# Patient Record
Sex: Male | Born: 1955 | Race: White | Hispanic: No | Marital: Married | Smoking: Never smoker
Health system: Southern US, Community
[De-identification: ages and names within clinical notes are randomized; demographics above are authoritative.]

## PROBLEM LIST (undated history)

## (undated) ENCOUNTER — Ambulatory Visit

## (undated) DIAGNOSIS — K219 Gastro-esophageal reflux disease without esophagitis: Secondary | ICD-10-CM

## (undated) DIAGNOSIS — M722 Plantar fascial fibromatosis: Secondary | ICD-10-CM

## (undated) HISTORY — PX: NASAL SINUS SURGERY: SHX719

## (undated) HISTORY — PX: BACK SURGERY: SHX140

---

## 2015-06-16 ENCOUNTER — Ambulatory Visit
Admission: EM | Admit: 2015-06-16 | Discharge: 2015-06-16 | Disposition: A | Discharge status: Home / Self Care | Payer: BLUE CROSS/BLUE SHIELD | Attending: Family Medicine | Admitting: Family Medicine

## 2015-06-16 ENCOUNTER — Encounter: Payer: Self-pay | Admitting: Emergency Medicine

## 2015-06-16 DIAGNOSIS — J101 Influenza due to other identified influenza virus with other respiratory manifestations: Secondary | ICD-10-CM

## 2015-06-16 LAB — RAPID INFLUENZA A&B ANTIGENS: Influenza B (ARMC): POSITIVE — AB

## 2015-06-16 LAB — RAPID INFLUENZA A&B ANTIGENS (ARMC ONLY): INFLUENZA A (ARMC): NEGATIVE

## 2015-06-16 MED ORDER — HYDROCOD POLST-CPM POLST ER 10-8 MG/5ML PO SUER: 5.0000 mL | Freq: Two times a day (BID) | ORAL | Status: DC

## 2015-06-16 MED ORDER — ACETAMINOPHEN 500 MG PO TABS
1000.0000 mg | ORAL_TABLET | Freq: Once | ORAL | Status: AC
  Administered 2015-06-16: 1000 mg via ORAL

## 2015-06-16 MED ORDER — OSELTAMIVIR PHOSPHATE 75 MG PO CAPS: 75.0000 mg | ORAL_CAPSULE | Freq: Two times a day (BID) | ORAL | Status: DC

## 2015-06-16 NOTE — ED Notes (Signed)
Patient c/o cough, runny nose, bodyaches and fever for the past 3 days.

## 2015-06-16 NOTE — ED Provider Notes (Signed)
CSN: [id number]     Arrival date & time 06/16/15  1935 History   First MD Initiated Contact with Patient 06/16/15 2111     Chief Complaint  Patient presents with  . Cough  . Fever  . Generalized Body Aches   (Consider location/radiation/quality/duration/timing/severity/associated sxs/prior Treatment) HPI  60 year old male presents with the sudden onset of body aches cough productive of green sputum fever. He did not have a flu shot this year. Left work early today because he feels so terrible. Temperature is 100.2 degrees pulse 75 respirations 16 blood pressure 147/87 and O2 sats 97% on room air        History reviewed. No pertinent past medical history. Past Surgical History  Procedure Laterality Date  . Nasal sinus surgery    . Back surgery     Family History  Problem Relation Age of Onset  . Cancer Mother   . Cancer Father    Social History  Substance Use Topics  . Smoking status: Never Smoker   . Smokeless tobacco: None  . Alcohol Use: No    Review of Systems  Constitutional: Positive for fever, chills, activity change and fatigue.  HENT: Positive for congestion.   Respiratory: Positive for cough and shortness of breath.   All other systems reviewed and are negative.   Allergies  Review of patient's allergies indicates no known allergies.  Home Medications   Prior to Admission medications   Medication Sig Start Date End Date Taking? Authorizing Provider  chlorpheniramine-HYDROcodone (TUSSIONEX PENNKINETIC ER) 10-8 MG/5ML SUER Take 5 mLs by mouth 2 (two) times daily. 06/16/15   Lutricia Feil, PA-C  oseltamivir (TAMIFLU) 75 MG capsule Take 1 capsule (75 mg total) by mouth 2 (two) times daily. 06/16/15   Lutricia Feil, PA-C   Meds Ordered and Administered this Visit   Medications  acetaminophen (TYLENOL) tablet 1,000 mg (1,000 mg Oral Given 06/16/15 2006)    BP 147/87 mmHg  Pulse 75  Temp(Src) 99.3 F (37.4 C) (Tympanic)  Resp 16  Ht  (1.778 m)   Wt 220 lb (99.791 kg)  BMI 31.57 kg/m2  SpO2 97% No data found.   Physical Exam  Constitutional: He is oriented to person, place, and time. He appears well-developed and well-nourished. No distress.  HENT:  Head: Normocephalic and atraumatic.  Right Ear: External ear normal.  Left Ear: External ear normal.  Nose: Nose normal.  Mouth/Throat: Oropharynx is clear and moist.  Eyes: Conjunctivae are normal. Pupils are equal, round, and reactive to light. Right eye exhibits no discharge. Left eye exhibits no discharge.  Neck: Normal range of motion. Neck supple.  Pulmonary/Chest: Effort normal and breath sounds normal. No respiratory distress. He has no wheezes. He has no rales.  Musculoskeletal: Normal range of motion. He exhibits no edema or tenderness.  Lymphadenopathy:    He has no cervical adenopathy.  Neurological: He is alert and oriented to person, place, and time.  Skin: Skin is warm and dry. He is not diaphoretic.  Psychiatric: He has a normal mood and affect. His behavior is normal. Judgment and thought content normal.  Nursing note and vitals reviewed.   ED Course  Procedures (including critical care time)  Labs Review Labs Reviewed  RAPID INFLUENZA A&B ANTIGENS ([facility] ONLY) - Abnormal; Notable for the following:    Influenza B ([facility]) POSITIVE (*)    All other components within normal limits    Imaging Review No results found.   Visual Acuity Review  Right  Eye Distance:   Left Eye Distance:   Bilateral Distance:    Right Eye Near:   Left Eye Near:    Bilateral Near:         MDM   1. Influenza B    Discharge Medication List as of 06/16/2015  9:20 PM    START taking these medications   Details  chlorpheniramine-HYDROcodone (TUSSIONEX PENNKINETIC ER) 10-8 MG/5ML SUER Take 5 mLs by mouth 2 (two) times daily., Starting 06/16/2015, Until Discontinued, Print    oseltamivir (TAMIFLU) 75 MG capsule Take 1 capsule (75 mg total) by mouth 2 (two) times daily.,  Starting 06/16/2015, Until Discontinued, Normal      Plan: 1. Test/x-ray results and diagnosis reviewed with patient 2. rx as per orders; risks, benefits, potential side effects reviewed with patient 3. Recommend supportive treatment with Rest and fluids. Motrin or Tylenol for body aches and fever. We'll start him on Tamiflu and given him Tussionex for cough suppression. Improving or is worsening any so follow-up is primary care.  4. F/u prn if symptoms worsen or don't improve     Lutricia FeilWilliam P Yaniel Limbaugh, PA-C 06/16/15 2126

## 2015-06-16 NOTE — Discharge Instructions (Signed)
Influenza, Adult Influenza ("the flu") is a viral infection of the respiratory tract. It occurs more often in winter months because people spend more time in close contact with one another. Influenza can make you feel very sick. Influenza easily spreads from person to person (contagious). CAUSES  Influenza is caused by a virus that infects the respiratory tract. You can catch the virus by breathing in droplets from an infected person's cough or sneeze. You can also catch the virus by touching something that was recently contaminated with the virus and then touching your mouth, nose, or eyes. RISKS AND COMPLICATIONS You may be at risk for a more severe case of influenza if you smoke cigarettes, have diabetes, have chronic heart disease (such as heart failure) or lung disease (such as asthma), or if you have a weakened immune system. Elderly people and pregnant women are also at risk for more serious infections. The most common problem of influenza is a lung infection (pneumonia). Sometimes, this problem can require emergency medical care and may be life threatening. SIGNS AND SYMPTOMS  Symptoms typically last 4 to 10 days and may include:  Fever.  Chills.  Headache, body aches, and muscle aches.  Sore throat.  Chest discomfort and cough.  Poor appetite.  Weakness or feeling tired.  Dizziness.  Nausea or vomiting. DIAGNOSIS  Diagnosis of influenza is often made based on your history and a physical exam. A nose or throat swab test can be done to confirm the diagnosis. TREATMENT  In mild cases, influenza goes away on its own. Treatment is directed at relieving symptoms. For more severe cases, your health care provider may prescribe antiviral medicines to shorten the sickness. Antibiotic medicines are not effective because the infection is caused by a virus, not by bacteria. HOME CARE INSTRUCTIONS  Take medicines only as directed by your health care provider.  Use a cool mist humidifier  to make breathing easier.  Get plenty of rest until your temperature returns to normal. This usually takes 3 to 4 days.  Drink enough fluid to keep your urine clear or pale yellow.  Cover yourmouth and nosewhen coughing or sneezing,and wash your handswellto prevent thevirusfrom spreading.  Stay homefromwork orschool untilthe fever is gonefor at least 201full day. PREVENTION  An annual influenza vaccination (flu shot) is the best way to avoid getting influenza. An annual flu shot is now routinely recommended for all adults in the [place] SEEK MEDICAL CARE IF:  You experiencechest pain, yourcough worsens,or you producemore mucus.  Youhave nausea,vomiting, ordiarrhea.  Your fever returns or gets worse. SEEK IMMEDIATE MEDICAL CARE IF:  You havetrouble breathing, you become short of breath,or your skin ornails becomebluish.  You have severe painor stiffnessin the neck.  You develop a sudden headache, or pain in the face or ear.  You have nausea or vomiting that you cannot control. MAKE SURE YOU:   Understand these instructions.  Will watch your condition.  Will get help right away if you are not doing well or get worse.   This information is not intended to replace advice given to you by your health care provider. Make sure you discuss any questions you have with your health care provider.   Document Released: 02/24/2000 Document Revised: 03/19/2014 Document Reviewed: 05/28/2011 Elsevier Interactive Patient Education 2016 [employer]  Influenza, Adult Influenza ("the flu") is a viral infection of the respiratory tract. It occurs more often in winter months because people spend more time in close contact with one another. Influenza can make  03/19/2014 Document Reviewed: 05/28/2011  Elsevier Interactive Patient Education ©2016 [employer]    Influenza, Adult  Influenza ("the flu") is a viral infection of the respiratory tract. It occurs more often in winter months because people spend more time in close contact with one another. Influenza can make you feel very sick. Influenza easily spreads from person to person (contagious).  CAUSES   Influenza is caused by a virus that infects the respiratory tract. You can catch the virus by breathing in droplets from an  infected person's cough or sneeze. You can also catch the virus by touching something that was recently contaminated with the virus and then touching your mouth, nose, or eyes.  RISKS AND COMPLICATIONS  You may be at risk for a more severe case of influenza if you smoke cigarettes, have diabetes, have chronic heart disease (such as heart failure) or lung disease (such as asthma), or if you have a weakened immune system. Elderly people and pregnant women are also at risk for more serious infections. The most common problem of influenza is a lung infection (pneumonia). Sometimes, this problem can require emergency medical care and may be life threatening.  SIGNS AND SYMPTOMS   Symptoms typically last 4 to 10 days and may include:  · Fever.  · Chills.  · Headache, body aches, and muscle aches.  · Sore throat.  · Chest discomfort and cough.  · Poor appetite.  · Weakness or feeling tired.  · Dizziness.  · Nausea or vomiting.  DIAGNOSIS   Diagnosis of influenza is often made based on your history and a physical exam. A nose or throat swab test can be done to confirm the diagnosis.  TREATMENT   In mild cases, influenza goes away on its own. Treatment is directed at relieving symptoms. For more severe cases, your health care provider may prescribe antiviral medicines to shorten the sickness. Antibiotic medicines are not effective because the infection is caused by a virus, not by bacteria.  HOME CARE INSTRUCTIONS  · Take medicines only as directed by your health care provider.  · Use a cool mist humidifier to make breathing easier.  · Get plenty of rest until your temperature returns to normal. This usually takes 3 to 4 days.  · Drink enough fluid to keep your urine clear or pale yellow.  · Cover your mouth and nose when coughing or sneezing, and wash your hands well to prevent the virus from spreading.  · Stay home from work or school until the fever is gone for at least 1 full day.  PREVENTION   An annual influenza  vaccination (flu shot) is the best way to avoid getting influenza. An annual flu shot is now routinely recommended for all adults in the [place]  SEEK MEDICAL CARE IF:  · You experience chest pain, your cough worsens, or you produce more mucus.  · You have nausea, vomiting, or diarrhea.  · Your fever returns or gets worse.  SEEK IMMEDIATE MEDICAL CARE IF:  · You have trouble breathing, you become short of breath, or your skin or nails become bluish.  · You have severe pain or stiffness in the neck.  · You develop a sudden headache, or pain in the face or ear.  · You have nausea or vomiting that you cannot control.  MAKE SURE YOU:   · Understand these instructions.  · Will watch your condition.  · Will get help right away if you are not doing well or get worse.     This information is not intended to replace advice given to you by your health care provider. Make sure you discuss any questions

## 2015-06-21 ENCOUNTER — Ambulatory Visit (INDEPENDENT_AMBULATORY_CARE_PROVIDER_SITE_OTHER): Payer: BLUE CROSS/BLUE SHIELD

## 2015-06-21 ENCOUNTER — Encounter: Payer: Self-pay | Admitting: Emergency Medicine

## 2015-06-21 ENCOUNTER — Ambulatory Visit
Admission: EM | Admit: 2015-06-21 | Discharge: 2015-06-21 | Disposition: A | Discharge status: Home / Self Care | Payer: BLUE CROSS/BLUE SHIELD | Attending: Family Medicine | Admitting: Family Medicine

## 2015-06-21 DIAGNOSIS — J40 Bronchitis, not specified as acute or chronic: Secondary | ICD-10-CM

## 2015-06-21 MED ORDER — AZITHROMYCIN 250 MG PO TABS: ORAL_TABLET | ORAL | Status: DC

## 2015-06-21 MED ORDER — HYDROCOD POLST-CPM POLST ER 10-8 MG/5ML PO SUER: 5.0000 mL | Freq: Every evening | ORAL | Status: DC | PRN

## 2015-06-21 MED ORDER — BENZONATATE 100 MG PO CAPS: 100.0000 mg | ORAL_CAPSULE | Freq: Three times a day (TID) | ORAL | Status: DC | PRN

## 2015-06-21 NOTE — Discharge Instructions (Signed)
Take medication as prescribed. Rest. Drink plenty of fluids.  ° °Follow up with your primary care physician this week as needed. Return to Urgent care for new or worsening concerns.  ° °

## 2015-06-21 NOTE — ED Provider Notes (Signed)
[facility]  ____________________________________________  Time seen: Approximately 6:35 PM  I have reviewed the triage vital signs and the nursing notes.   HISTORY  Chief Complaint Cough   HPI Derrick Haynes is a 60 y.o. male presents with a complaint of 1.5 weeks of runny nose, nasal congestion, cough. Patient reports that he was seen in urgent care about a week ago was diagnosed with the flu. Patient states that he since completed his cough medicine as well as the Tamiflu. Patient reports overall the congestion has improved but continues with cough. Patient reports that he occasionally feels that he has chills and some body aches. Patient denies known fevers. Reports no fevers when he has checked it at home. States the cough is occasionally productive with some greenish phlegm. States occasionally has some sinus pressure and his cheeks and states that he can blow his nose and greenish mucus out. Denies headache.  Denies chest pain, shortness of breath, abdominal pain, dysuria, neck pain, back pain, weakness, dizziness, extremity pain or extremity swelling. Reports overall continues to eat and drink well. Denies other known sick contacts.  [place]   History reviewed. No pertinent past medical history. denies There are no active problems to display for this patient. denies   Past Surgical History  Procedure Laterality Date  . Nasal sinus surgery    . Back surgery      Current Outpatient Rx  Name  Route  Sig  Dispense  Refill  .           Marland Kitchen.             Allergies Review of patient's allergies indicates no known allergies.  Family History  Problem Relation Age of Onset  . Cancer Mother   . Cancer Father     Social History Social History  Substance Use Topics  . Smoking status: Never Smoker   . Smokeless tobacco: None  . Alcohol Use: No    Review of Systems Constitutional:As above Eyes: No visual changes. ENT: No sore throat. Positive runny  nose, nasal congestion and cough. Cardiovascular: Denies chest pain. Respiratory: Denies shortness of breath. Gastrointestinal: No abdominal pain.  No nausea, no vomiting.  No diarrhea.  No constipation. Genitourinary: Negative for dysuria. Musculoskeletal: Negative for back pain. Skin: Negative for rash. Neurological: Negative for headaches, focal weakness or numbness.  10-point ROS otherwise negative.  ____________________________________________   PHYSICAL EXAM:  VITAL SIGNS: ED Triage Vitals  Enc Vitals Group     BP 06/21/15 1818 143/80 mmHg     Pulse Rate 06/21/15 1818 68     Resp 06/21/15 1818 16     Temp 06/21/15 1818 98.5 F (36.9 C)     Temp Source 06/21/15 1818 Oral     SpO2 06/21/15 1818 98 %     Weight 06/21/15 1818 220 lb (99.791 kg)     Height 06/21/15 1818 5\' 10"  (1.778 m)     Head Cir --      Peak Flow --      Pain Score 06/21/15 1820 1     Pain Loc --      Pain Edu? --      Excl. in GC? --    Constitutional: Alert and oriented. Well appearing and in no acute distress. Eyes: Conjunctivae are normal. PERRL. EOMI. Head: Atraumatic.Mild tenderness to palpation bilateral frontal and maxillary sinuses. No swelling. No erythema.   Ears: no erythema, normal TMs bilaterally.   Nose: nasal congestion with bilateral  nasal turbinate erythema and edema.   Mouth/Throat: Mucous membranes are moist.  Oropharynx non-erythematous.No tonsillar swelling or exudate.  Neck: No stridor.  No cervical spine tenderness to palpation. Hematological/Lymphatic/Immunilogical: No cervical lymphadenopathy. Cardiovascular: Normal rate, regular rhythm. Grossly normal heart sounds.  Good peripheral circulation. Respiratory: Normal respiratory effort.  No retractions. Mild scattered rhonchi, increased mild rhonchi left upper lobe. Rhonchi improves after cough. Dry intermittent cough noted in room. No wheezes or rales. Speaking in complete sentences. Ambulatory in room while speaking in  complete sentences. Good air movement.  Gastrointestinal: Soft and nontender. No distention. Normal Bowel sounds. No CVA tenderness. Musculoskeletal: No lower or upper extremity tenderness nor edema.  Bilateral pedal pulses equal and easily palpated. No cervical, thoracic or lumbar tenderness to palpation. No calf tenderness bilaterally.  Neurologic:  Normal speech and language. No gross focal neurologic deficits are appreciated. No gait instability. Skin:  Skin is warm, dry and intact. No rash noted. Psychiatric: Mood and affect are normal. Speech and behavior are normal.  ____________________________________________   LABS (all labs ordered are listed, but only abnormal results are displayed)  Labs Reviewed - No data to display ____________________________________________  RADIOLOGY   EXAM: CHEST 2 VIEW  COMPARISON: None.  FINDINGS: The heart size and mediastinal contours are within normal limits. Both lungs are clear. The visualized skeletal structures are unremarkable.  IMPRESSION: No active cardiopulmonary disease.   Electronically Signed By: Alcide Clever M.D. On: 06/21/2015 19:00  I, Renford Dills, personally viewed and evaluated these images (plain radiographs) as part of my medical decision making, as well as reviewing the written report by the radiologist.    INITIAL IMPRESSION / ASSESSMENT AND PLAN / ED COURSE  Pertinent labs & imaging results that were available during my care of the patient were reviewed by me and considered in my medical decision making (see chart for details).  Very well-appearing patient. No acute distress. Presents for the complaints of continued cough and congestion for the last week post influenza. Reports overall symptoms present 1.5-2 weeks. Lungs with scattered rhonchi increase in left upper lobe. Improves post cough. No wheezes. Speaks in complete sentences. Denies chest pain or shortness of breath. Also with nasal drainage.  Will evaluate chest x-ray. Suspect post influenza bronchitis.  Chest x-ray per radiologist no active cardiopulmonary disease. Will treat bronchitis with oral azithromycin, when necessary Tessalon Perles in the day and when necessary Tussionex at night. Patient reports he took Tussionex When he had the flu and states that that helped his cough quite a bit and tolerated well. Encouraged rest, fluids, over-the-counter Tylenol or ibuprofen. Encourage PCP follow up.Discussed indication, risks and benefits of medications with patient.  Discussed follow up with Primary care physician this week. Discussed follow up and return parameters including no resolution or any worsening concerns. Patient verbalized understanding and agreed to plan.   ____________________________________________   FINAL CLINICAL IMPRESSION(S) / ED DIAGNOSES  Final diagnoses:  Bronchitis      Note: This dictation was prepared with Dragon dictation along with smaller phrase technology. Any transcriptional errors that result from this process are unintentional.    Renford Dills, NP 06/21/15 2020

## 2015-06-21 NOTE — ED Notes (Signed)
Patient c/o cough that has not improved.  Patient denies fever.  Patient states that he finished his Tamiflu.  Patient reports that the Tussionex has helped his cough at night.

## 2016-11-11 IMAGING — CR DG CHEST 2V
2 series · 2 of 2 positions shown · non-contrast
Comparison: None.

CLINICAL DATA: Chills and productive cough for 1 week

EXAM:
CHEST  2 VIEW

[chest pa]
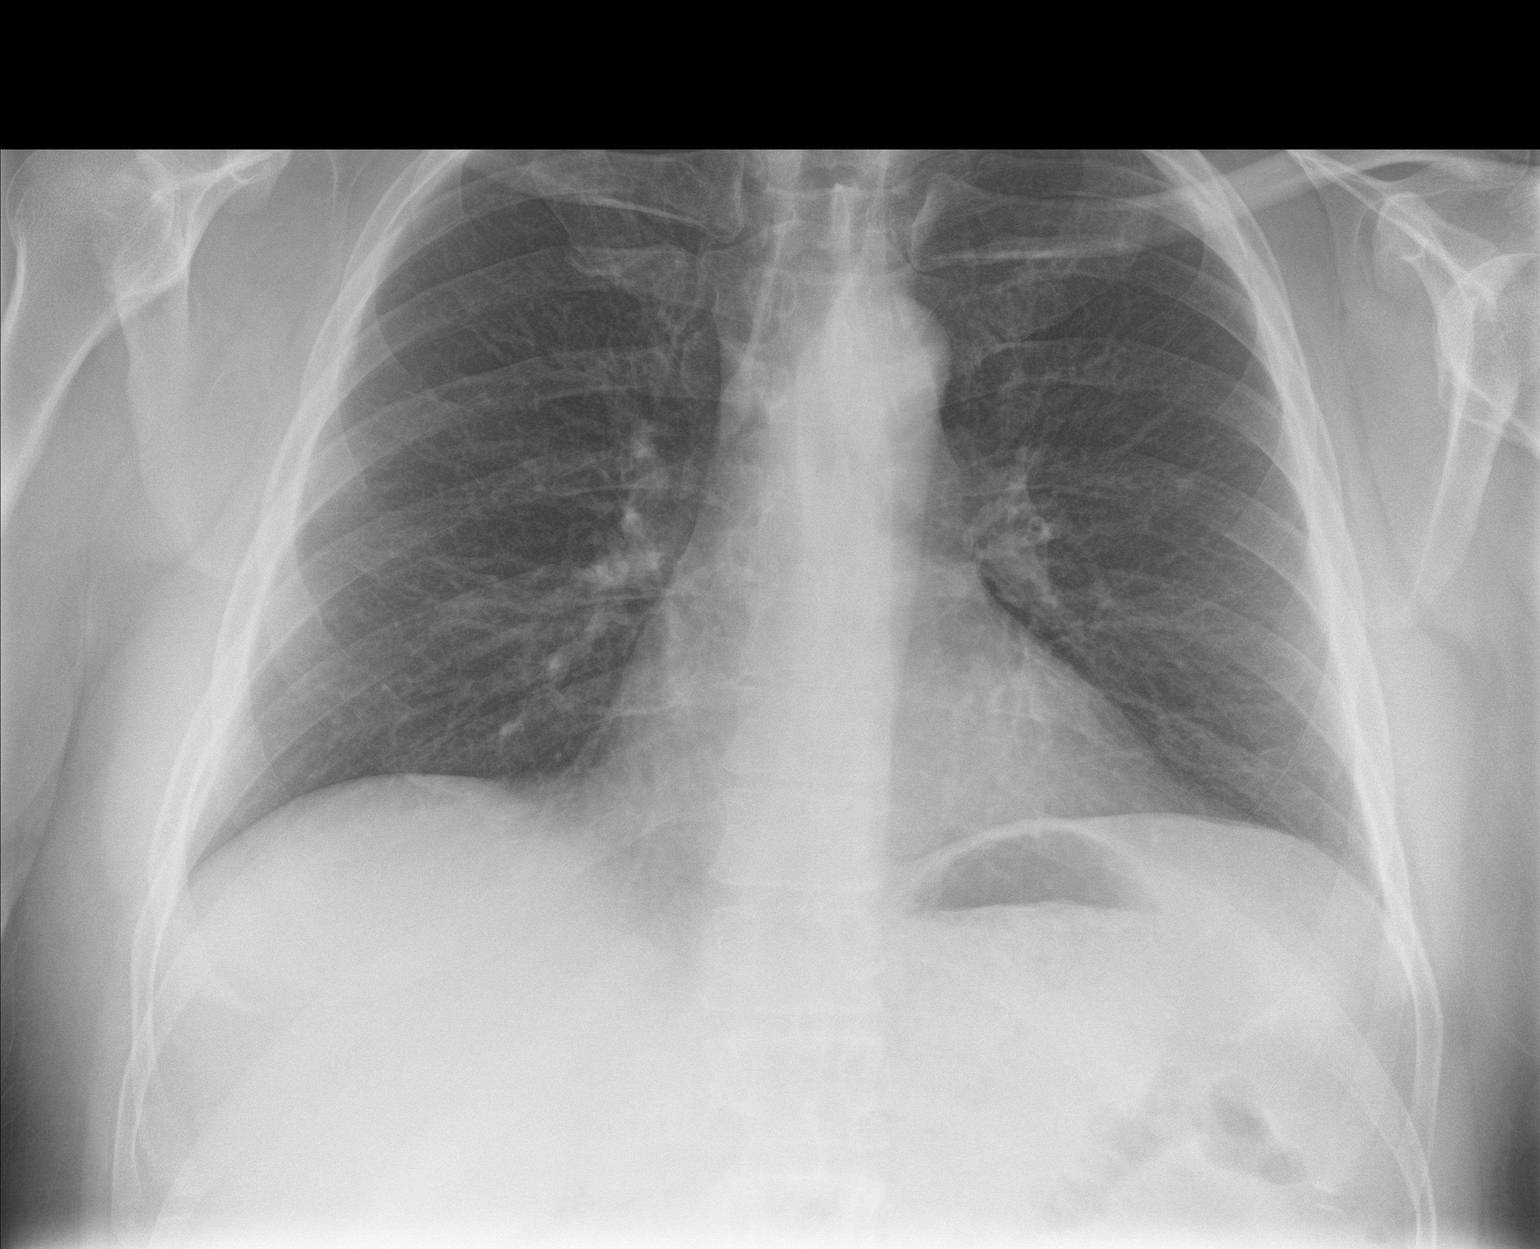

[chest lat]
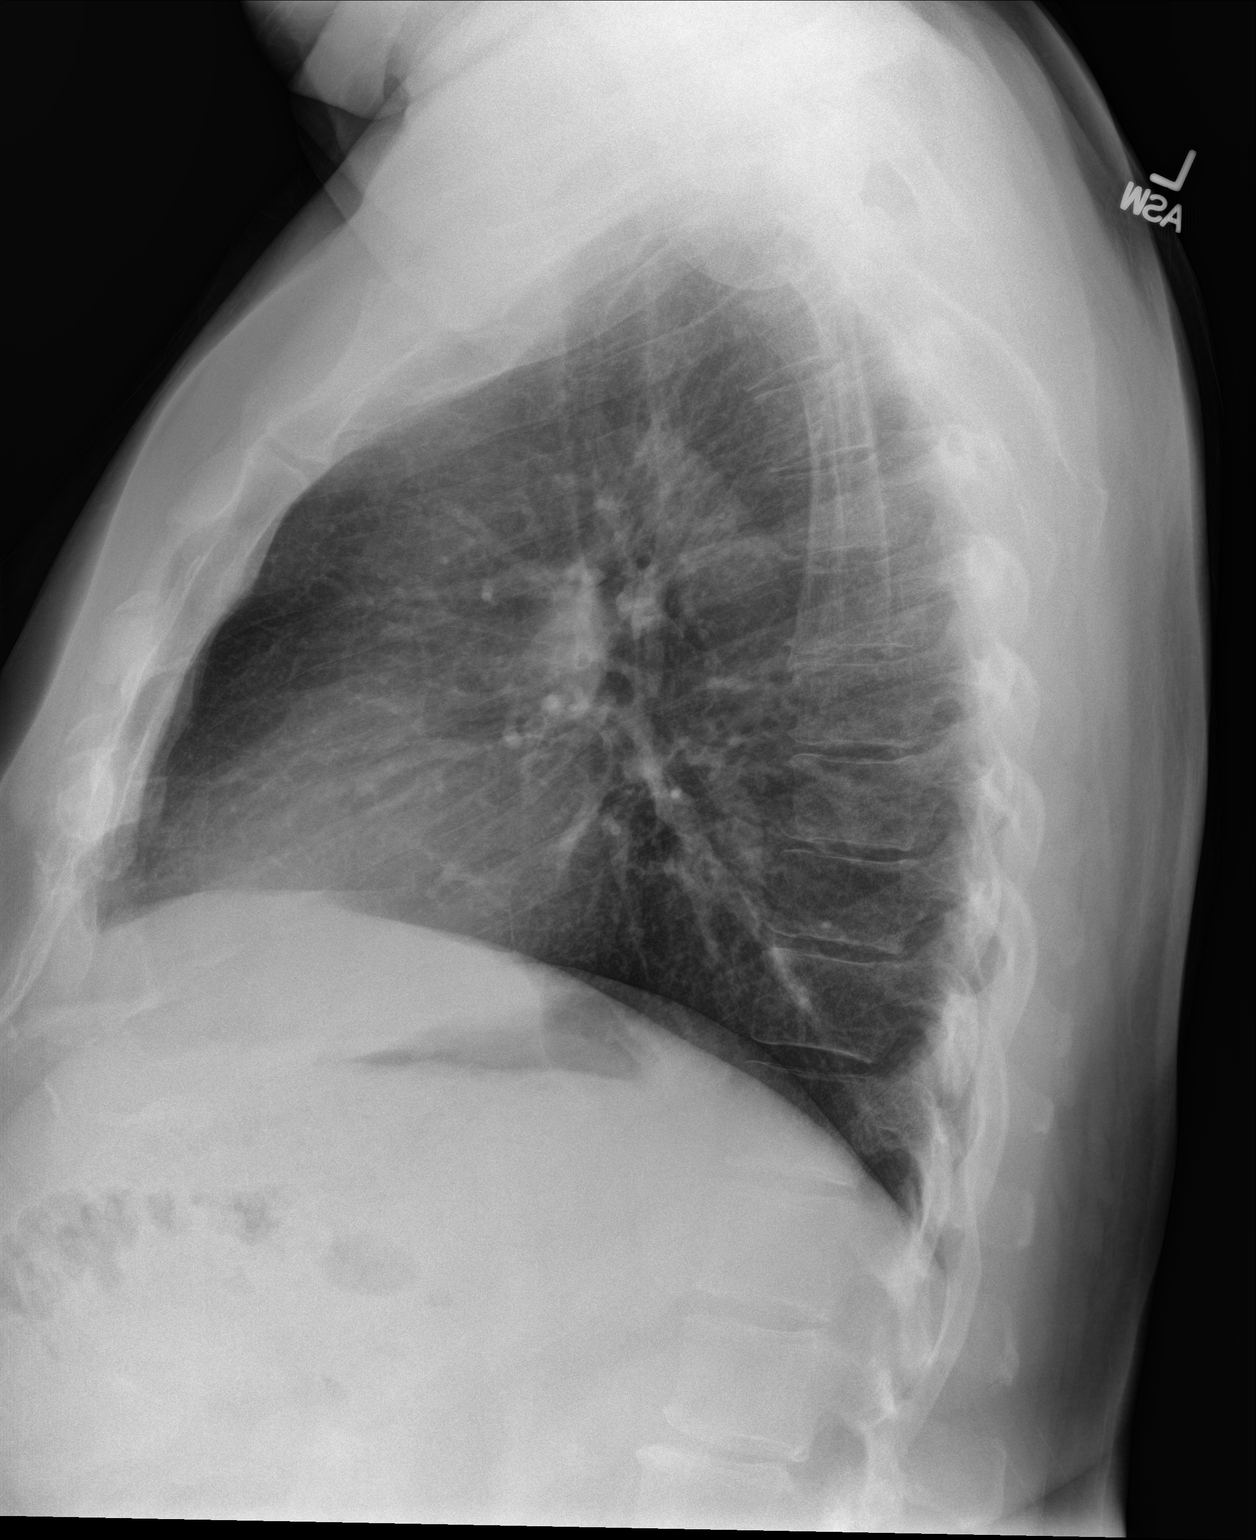

[2 of 2 positions shown; findings below may reference images not displayed]

FINDINGS: The heart size and mediastinal contours are within normal limits.
Both lungs are clear. The visualized skeletal structures are
unremarkable.
IMPRESSION: No active cardiopulmonary disease.

## 2017-07-22 ENCOUNTER — Other Ambulatory Visit: Payer: Self-pay

## 2017-07-22 ENCOUNTER — Ambulatory Visit
Admission: EM | Admit: 2017-07-22 | Discharge: 2017-07-22 | Disposition: A | Discharge status: Home / Self Care | Payer: 59 | Attending: Family Medicine | Admitting: Family Medicine

## 2017-07-22 DIAGNOSIS — M542 Cervicalgia: Secondary | ICD-10-CM

## 2017-07-22 DIAGNOSIS — M546 Pain in thoracic spine: Secondary | ICD-10-CM | POA: Diagnosis not present

## 2017-07-22 HISTORY — DX: Gastro-esophageal reflux disease without esophagitis: K21.9

## 2017-07-22 HISTORY — DX: Plantar fascial fibromatosis: M72.2

## 2017-07-22 MED ORDER — PREDNISONE 10 MG (21) PO TBPK: ORAL_TABLET | ORAL | 0 refills | Status: DC

## 2017-07-22 MED ORDER — CYCLOBENZAPRINE HCL 10 MG PO TABS: ORAL_TABLET | ORAL | 0 refills | Status: DC

## 2017-07-22 NOTE — Discharge Instructions (Addendum)
Recommend start Prednisone  6 day dose pack as directed- take 6 tablets by mouth today and then decrease by 1 tablet each day until finished on day 6. Recommend Flexeril muscle relaxer- take 1/2 to 1 whole tablet up to 3 times a day as needed for muscle spasms/pain. Recommend apply heat and alternate with cool compresses to area for comfort. Recommend contact your Orthopedic in 3 to 5 days if not improving.
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
## 2020-05-06 ENCOUNTER — Ambulatory Visit
Admission: EM | Admit: 2020-05-06 | Discharge: 2020-05-06 | Disposition: A | Discharge status: Home / Self Care | Payer: 59 | Attending: Family Medicine | Admitting: Family Medicine

## 2020-05-06 ENCOUNTER — Ambulatory Visit (INDEPENDENT_AMBULATORY_CARE_PROVIDER_SITE_OTHER): Payer: 59

## 2020-05-06 ENCOUNTER — Other Ambulatory Visit: Payer: Self-pay

## 2020-05-06 DIAGNOSIS — R0781 Pleurodynia: Secondary | ICD-10-CM

## 2020-05-06 DIAGNOSIS — R062 Wheezing: Secondary | ICD-10-CM

## 2020-05-06 DIAGNOSIS — R059 Cough, unspecified: Secondary | ICD-10-CM

## 2020-05-06 MED ORDER — PREDNISONE 50 MG PO TABS: ORAL_TABLET | ORAL | 0 refills | Status: DC

## 2020-05-06 MED ORDER — HYDROCOD POLST-CPM POLST ER 10-8 MG/5ML PO SUER: 5.0000 mL | Freq: Two times a day (BID) | ORAL | 0 refills | Status: DC | PRN

## 2020-05-06 MED ORDER — ALBUTEROL SULFATE HFA 108 (90 BASE) MCG/ACT IN AERS: 1.0000 | INHALATION_SPRAY | Freq: Four times a day (QID) | RESPIRATORY_TRACT | 0 refills | Status: DC | PRN

## 2020-05-06 NOTE — ED Provider Notes (Signed)
[facility]    CSN: [id number] Arrival date & time: 05/06/20  0808      History   Chief Complaint Chief Complaint  Patient presents with  . Cough   HPI  65 year old male presents with cough.  Symptoms started Wednesday.  He reports cough, chills, rib pain. No documented fever. No reported sick contacts. Cough is productive. No relieving factors. No other complaints at this time.  Past Medical History:  Diagnosis Date  . GERD (gastroesophageal reflux disease)   . Plantar fasciitis    Past Surgical History:  Procedure Laterality Date  . BACK SURGERY    . NASAL SINUS SURGERY     Home Medications    Prior to Admission medications   Medication Sig Start Date End Date Taking? Authorizing Provider  albuterol (VENTOLIN HFA) 108 (90 Base) MCG/ACT inhaler Inhale 1-2 puffs into the lungs every 6 (six) hours as needed for wheezing or shortness of breath. 05/06/20  Yes Johan Creveling G, DO  chlorpheniramine-HYDROcodone (TUSSIONEX PENNKINETIC ER) 10-8 MG/5ML SUER Take 5 mLs by mouth every 12 (twelve) hours as needed. 05/06/20  Yes Jamel Holzmann, Verdis Frederickson, DO  predniSONE (DELTASONE) 50 MG tablet 1 tablet daily x 5 days 05/06/20  Yes Tommie Sams, DO  omeprazole (PRILOSEC) 40 MG capsule  07/08/17 05/06/20  [provider]    Family History Family History  Problem Relation Age of Onset  . Cancer Mother   . Cancer Father     Social History Social History   Tobacco Use  . Smoking status: Never Smoker  . Smokeless tobacco: Never Used  Vaping Use  . Vaping Use: Never used  Substance Use Topics  . Alcohol use: Yes    Comment: rare  . Drug use: Never     Allergies   Patient has no known allergies.   Review of Systems Review of Systems  Constitutional: Positive for chills. Negative for fever.  Respiratory: Positive for cough and wheezing.    Physical Exam Triage Vital Signs ED Triage Vitals  Enc Vitals Group     BP 05/06/20 0850 136/81     Pulse Rate 05/06/20  0850 (!) 58     Resp 05/06/20 0850 18     Temp 05/06/20 0850 98.6 F (37 C)     Temp Source 05/06/20 0850 Oral     SpO2 05/06/20 0850 98 %     Weight 05/06/20 0849 215 lb (97.5 kg)     Height 05/06/20 0849 5\' 9"  (1.753 m)     Head Circumference --      Peak Flow --      Pain Score 05/06/20 0848 9     Pain Loc --      Pain Edu? --      Excl. in GC? --    Updated Vital Signs BP 136/81 (BP Location: Left Arm)   Pulse (!) 58   Temp 98.6 F (37 C) (Oral)   Resp 18   Ht 5\' 9"  (1.753 m)   Wt 97.5 kg   SpO2 98%   BMI 31.75 kg/m   Visual Acuity Right Eye Distance:   Left Eye Distance:   Bilateral Distance:    Right Eye Near:   Left Eye Near:    Bilateral Near:     Physical Exam Vitals and nursing note reviewed.  Constitutional:      General: He is not in acute distress.    Appearance: Normal appearance. He is not ill-appearing.  HENT:  Head: Normocephalic and atraumatic.  Eyes:     General:        Right eye: No discharge.        Left eye: No discharge.     Conjunctiva/sclera: Conjunctivae normal.  Cardiovascular:     Rate and Rhythm: Regular rhythm. Bradycardia present.  Pulmonary:     Effort: Pulmonary effort is normal. No respiratory distress.     Breath sounds: Wheezing present.  Neurological:     Mental Status: He is alert.  Psychiatric:        Mood and Affect: Mood normal.        Behavior: Behavior normal.    UC Treatments / Results  Labs (all labs ordered are listed, but only abnormal results are displayed) Labs Reviewed - No data to display  EKG   Radiology DG Chest 2 View  Result Date: 05/06/2020 CLINICAL DATA:  Cough, chest congestion, chills, and rib pain since Wednesday. EXAM: CHEST - 2 VIEW COMPARISON:  Chest x-ray dated June 21, 2015. FINDINGS: The heart size and mediastinal contours are within normal limits. Both lungs are clear. The visualized skeletal structures are unremarkable. IMPRESSION: No active cardiopulmonary disease.  Electronically Signed   By: Obie Dredge M.D.   On: 05/06/2020 09:55    Procedures Procedures (including critical care time)  Medications Ordered in UC Medications - No data to display  Initial Impression / Assessment and Plan / UC Course  I have reviewed the triage vital signs and the nursing notes.  Pertinent labs & imaging results that were available during my care of the patient were reviewed by me and considered in my medical decision making (see chart for details).    65 year old male presents with cough and wheezing.  Chest x-ray was obtained was independently by me.  Interpretation: Normal chest x-ray.  No evidence of infiltrate.  Placed on prednisone, albuterol, Tussionex.  Supportive care.  Final Clinical Impressions(s) / UC Diagnoses   Final diagnoses:  Cough  Wheezing     Discharge Instructions     Medication as prescribed.  Chest xray normal.  Take care  Dr. Adriana Simas    ED Prescriptions    Medication Sig Dispense Auth. Provider   predniSONE (DELTASONE) 50 MG tablet 1 tablet daily x 5 days 5 tablet Denene Alamillo G, DO   albuterol (VENTOLIN HFA) 108 (90 Base) MCG/ACT inhaler Inhale 1-2 puffs into the lungs every 6 (six) hours as needed for wheezing or shortness of breath. 18 g Lowen Barringer G, DO   chlorpheniramine-HYDROcodone (TUSSIONEX PENNKINETIC ER) 10-8 MG/5ML SUER Take 5 mLs by mouth every 12 (twelve) hours as needed. 115 mL Tommie Sams, DO     PDMP not reviewed this encounter.   Tommie Sams, DO 05/06/20 1050

## 2020-05-06 NOTE — Discharge Instructions (Signed)
Medication as prescribed.  Chest xray normal.  Take care  Dr. Adriana Simas

## 2020-05-06 NOTE — ED Triage Notes (Signed)
Patient complains of cough, chest congestion, rib pain and chills x Wednesday evening.

## 2021-08-10 ENCOUNTER — Ambulatory Visit
Admission: EM | Admit: 2021-08-10 | Discharge: 2021-08-10 | Disposition: A | Discharge status: Home / Self Care | Payer: Medicare Other

## 2021-08-10 DIAGNOSIS — J069 Acute upper respiratory infection, unspecified: Secondary | ICD-10-CM | POA: Diagnosis not present

## 2021-08-10 DIAGNOSIS — R051 Acute cough: Secondary | ICD-10-CM

## 2021-08-10 MED ORDER — DOXYCYCLINE HYCLATE 100 MG PO CAPS: 100.0000 mg | ORAL_CAPSULE | Freq: Two times a day (BID) | ORAL | 0 refills | Status: DC

## 2021-08-10 NOTE — Discharge Instructions (Addendum)
Please get established with PCP of your choice for general medical issues. Continue nasacort, claritin as directed. Take antibiotic as directed. Follow up with PCP. Return as needed. If you develop SOB, or chest pain go to ER. Drink plent yof water to stay hydrated. Avoid sunlight while on medication as it may cause photosensitivity/rash.

## 2021-08-10 NOTE — ED Triage Notes (Signed)
Pt here with C/O cough since Friday, spitting up green stuff. No fever, just feels off.

## 2021-08-10 NOTE — ED Provider Notes (Signed)
[facility]    CSN: [id number] Arrival date & time: 08/10/21  1119      History   Chief Complaint Chief Complaint  Patient presents with   Cough    HPI Derrick Haynes is a 66 y.o. male.   66 year old male patient, Derrick Haynes, presents to urgent care chief complaint of cough x5 days, spitting up green stuff. Pt states cough and symptoms are worse when lies down, +facial pressure.   The history is provided by the patient. No language interpreter was used.   Past Medical History:  Diagnosis Date   GERD (gastroesophageal reflux disease)    Plantar fasciitis     Patient Active Problem List   Diagnosis Date Noted   Acute URI 08/10/2021   Acute cough 08/10/2021    Past Surgical History:  Procedure Laterality Date   BACK SURGERY     NASAL SINUS SURGERY         Home Medications    Prior to Admission medications   Medication Sig Start Date End Date Taking? Authorizing Provider  atorvastatin (LIPITOR) 20 MG tablet Take 1 tablet by mouth daily. 05/08/21  Yes [provider]  doxycycline (VIBRAMYCIN) 100 MG capsule Take 1 capsule (100 mg total) by mouth 2 (two) times daily. 08/10/21  Yes Leondra Cullin, Para March, NP  albuterol (VENTOLIN HFA) 108 (90 Base) MCG/ACT inhaler Inhale 1-2 puffs into the lungs every 6 (six) hours as needed for wheezing or shortness of breath. 05/06/20   Tommie Sams, DO  chlorpheniramine-HYDROcodone (TUSSIONEX PENNKINETIC ER) 10-8 MG/5ML SUER Take 5 mLs by mouth every 12 (twelve) hours as needed. 05/06/20   Tommie Sams, DO  predniSONE (DELTASONE) 50 MG tablet 1 tablet daily x 5 days 05/06/20   Tommie Sams, DO  omeprazole (PRILOSEC) 40 MG capsule  07/08/17 05/06/20  [provider]    Family History Family History  Problem Relation Age of Onset   Cancer Mother    Cancer Father     Social History Social History   Tobacco Use   Smoking status: Never   Smokeless tobacco: Never  Vaping Use   Vaping Use: Never used   Substance Use Topics   Alcohol use: Yes    Comment: rare   Drug use: Never     Allergies   Patient has no known allergies.   Review of Systems Review of Systems  HENT:  Positive for congestion, sinus pressure and sinus pain.   Respiratory:  Positive for cough.   All other systems reviewed and are negative.   Physical Exam Triage Vital Signs ED Triage Vitals  Enc Vitals Group     BP 08/10/21 1144 140/90     Pulse Rate 08/10/21 1144 73     Resp 08/10/21 1144 18     Temp 08/10/21 1144 98.2 F (36.8 C)     Temp Source 08/10/21 1144 Oral     SpO2 08/10/21 1144 96 %     Weight 08/10/21 1143 230 lb (104.3 kg)     Height 08/10/21 1143 5\' 10"  (1.778 m)     Head Circumference --      Peak Flow --      Pain Score 08/10/21 1143 0     Pain Loc --      Pain Edu? --      Excl. in GC? --    No data found.  Updated Vital Signs BP 140/90 (BP Location: Left Arm)   Pulse 73   Temp  98.2 F (36.8 C) (Oral)   Resp 18   Ht  (1.778 m)   Wt 230 lb (104.3 kg)   SpO2 96%   BMI 33.00 kg/m   Visual Acuity Right Eye Distance:   Left Eye Distance:   Bilateral Distance:    Right Eye Near:   Left Eye Near:    Bilateral Near:     Physical Exam Vitals and nursing note reviewed.  Constitutional:      General: He is not in acute distress.    Appearance: He is well-developed and well-groomed.  HENT:     Head: Normocephalic and atraumatic.     Right Ear: Tympanic membrane is retracted.     Left Ear: Tympanic membrane is retracted.     Nose: Congestion present.     Mouth/Throat:     Lips: Pink.     Mouth: Mucous membranes are moist.     Pharynx: Oropharynx is clear.  Eyes:     General: Lids are normal. Vision grossly intact.     Extraocular Movements: Extraocular movements intact.     Conjunctiva/sclera: Conjunctivae normal.     Pupils: Pupils are equal, round, and reactive to light.  Neck:     Trachea: Trachea normal.  Cardiovascular:     Rate and Rhythm: Normal  rate and regular rhythm.     Pulses: Normal pulses.     Heart sounds: Normal heart sounds. No murmur heard. Pulmonary:     Effort: Pulmonary effort is normal. No respiratory distress.     Breath sounds: Normal breath sounds and air entry.  Abdominal:     Palpations: Abdomen is soft.     Tenderness: There is no abdominal tenderness.  Musculoskeletal:        General: No swelling.     Cervical back: Normal range of motion and neck supple.  Skin:    General: Skin is warm and dry.     Capillary Refill: Capillary refill takes less than 2 seconds.  Neurological:     General: No focal deficit present.     Mental Status: He is alert and oriented to person, place, and time.     GCS: GCS eye subscore is 4. GCS verbal subscore is 5. GCS motor subscore is 6.  Psychiatric:        Attention and Perception: Attention normal.        Mood and Affect: Mood normal.        Speech: Speech normal.        Behavior: Behavior normal. Behavior is cooperative.     UC Treatments / Results  Labs (all labs ordered are listed, but only abnormal results are displayed) Labs Reviewed - No data to display  EKG   Radiology No results found.  Procedures Procedures (including critical care time)  Medications Ordered in UC Medications - No data to display  Initial Impression / Assessment and Plan / UC Course  I have reviewed the triage vital signs and the nursing notes.  Pertinent labs & imaging results that were available during my care of the patient were reviewed by me and considered in my medical decision making (see chart for details).     Ddx: URI, cough, sinusitis,sseasonal allergies Final Clinical Impressions(s) / UC Diagnoses   Final diagnoses:  Acute URI  Acute cough     Discharge Instructions      Please get established with PCP of your choice for general medical issues. Continue nasacort, claritin as directed. Take antibiotic as  directed. Follow up with PCP. Return as needed. If  you develop SOB, or chest pain go to ER. Drink plent yof water to stay hydrated. Avoid sunlight while on medication as it may cause photosensitivity/rash.     ED Prescriptions     Medication Sig Dispense Auth. Provider   doxycycline (VIBRAMYCIN) 100 MG capsule Take 1 capsule (100 mg total) by mouth 2 (two) times daily. 20 capsule Gwendoline Judy, Para March, NP      PDMP not reviewed this encounter.   Clancy Gourd, NP 08/10/21 1313

## 2021-10-10 HISTORY — PX: EYE SURGERY: SHX253

## 2021-12-22 ENCOUNTER — Ambulatory Visit
Admission: EM | Admit: 2021-12-22 | Discharge: 2021-12-22 | Disposition: A | Discharge status: Home / Self Care | Payer: Medicare Other

## 2021-12-22 DIAGNOSIS — J069 Acute upper respiratory infection, unspecified: Secondary | ICD-10-CM | POA: Diagnosis not present

## 2021-12-22 MED ORDER — DOXYCYCLINE MONOHYDRATE 100 MG PO CAPS: 100.0000 mg | ORAL_CAPSULE | Freq: Two times a day (BID) | ORAL | 0 refills | Status: DC

## 2021-12-22 NOTE — ED Provider Notes (Signed)
[facility]    CSN: [id number] Arrival date & time: 12/22/21  1022      History   Chief Complaint Chief Complaint  Patient presents with   Cough   Nasal Congestion    HPI Derrick Haynes is a 66 y.o. male.   Patient presents with nasal congestion, rhinorrhea, sore throat and a productive cough with clear sputum for 3 weeks.  Associated shortness of breath at rest and intermittent wheezing.  Sore throat has resolved.  Tolerating food and liquids.  No known sick contacts.  Has attempted use of aspirin which has been ineffective.  Denies respiratory history.  Non-smoker.  Past Medical History:  Diagnosis Date   GERD (gastroesophageal reflux disease)    Plantar fasciitis     Patient Active Problem List   Diagnosis Date Noted   Acute URI 08/10/2021   Acute cough 08/10/2021    Past Surgical History:  Procedure Laterality Date   BACK SURGERY     EYE SURGERY  10/2021   NASAL SINUS SURGERY         Home Medications    Prior to Admission medications   Medication Sig Start Date End Date Taking? Authorizing Provider  escitalopram (LEXAPRO) 10 MG tablet Take 10 mg by mouth daily. 07/09/21  Yes [provider]  albuterol (VENTOLIN HFA) 108 (90 Base) MCG/ACT inhaler Inhale 1-2 puffs into the lungs every 6 (six) hours as needed for wheezing or shortness of breath. 05/06/20   Thersa Salt G, DO  atorvastatin (LIPITOR) 20 MG tablet Take 1 tablet by mouth daily. 05/08/21   [provider]  chlorpheniramine-HYDROcodone (TUSSIONEX PENNKINETIC ER) 10-8 MG/5ML SUER Take 5 mLs by mouth every 12 (twelve) hours as needed. 05/06/20   Coral Spikes, DO  doxycycline (VIBRAMYCIN) 100 MG capsule Take 1 capsule (100 mg total) by mouth 2 (two) times daily. 4/0/98   Defelice, Jeanett Schlein, NP  predniSONE (DELTASONE) 50 MG tablet 1 tablet daily x 5 days 05/06/20   Coral Spikes, DO  omeprazole (PRILOSEC) 40 MG capsule  07/08/17 05/06/20  [provider]    Family  History Family History  Problem Relation Age of Onset   Cancer Mother    Cancer Father     Social History Social History   Tobacco Use   Smoking status: Never   Smokeless tobacco: Never  Vaping Use   Vaping Use: Never used  Substance Use Topics   Alcohol use: Yes    Comment: rare   Drug use: Never     Allergies   Patient has no known allergies.   Review of Systems Review of Systems Defer to HPI    Physical Exam Triage Vital Signs ED Triage Vitals  Enc Vitals Group     BP 12/22/21 1033 (!) 145/83     Pulse Rate 12/22/21 1033 95     Resp 12/22/21 1033 12     Temp 12/22/21 1033 98.1 F (36.7 C)     Temp Source 12/22/21 1033 Oral     SpO2 12/22/21 1033 95 %     Weight 12/22/21 1031 235 lb (106.6 kg)     Height 12/22/21 1031 5\' 10"  (1.778 m)     Head Circumference --      Peak Flow --      Pain Score 12/22/21 1031 10     Pain Loc --      Pain Edu? --      Excl. in [place]? --    No  data found.  Updated Vital Signs BP (!) 145/83 (BP Location: Left Arm)   Pulse 95   Temp 98.1 F (36.7 C) (Oral)   Resp 12   Ht 5\' 10"  (1.778 m)   Wt 235 lb (106.6 kg)   SpO2 95%   BMI 33.72 kg/m   Visual Acuity Right Eye Distance:   Left Eye Distance:   Bilateral Distance:    Right Eye Near:   Left Eye Near:    Bilateral Near:     Physical Exam Constitutional:      Appearance: Normal appearance.  HENT:     Head: Normocephalic.     Right Ear: Tympanic membrane, ear canal and external ear normal.     Left Ear: Tympanic membrane, ear canal and external ear normal.     Nose: Congestion present.     Mouth/Throat:     Mouth: Mucous membranes are moist.     Pharynx: Posterior oropharyngeal erythema present.  Eyes:     Extraocular Movements: Extraocular movements intact.  Cardiovascular:     Rate and Rhythm: Normal rate and regular rhythm.     Pulses: Normal pulses.     Heart sounds: Normal heart sounds.  Pulmonary:     Effort: Pulmonary effort is normal.      Breath sounds: Normal breath sounds.  Musculoskeletal:        General: Normal range of motion.  Skin:    General: Skin is warm and dry.  Neurological:     Mental Status: He is alert and oriented to person, place, and time. Mental status is at baseline.  Psychiatric:        Mood and Affect: Mood normal.        Behavior: Behavior normal.      UC Treatments / Results  Labs (all labs ordered are listed, but only abnormal results are displayed) Labs Reviewed - No data to display  EKG   Radiology No results found.  Procedures Procedures (including critical care time)  Medications Ordered in UC Medications - No data to display  Initial Impression / Assessment and Plan / UC Course  I have reviewed the triage vital signs and the nursing notes.  Pertinent labs & imaging results that were available during my care of the patient were reviewed by me and considered in my medical decision making (see chart for details).  Acute upper respiratory infection  Vital signs are stable patient is in no signs of distress nontoxic-appearing, lungs are clear to auscultation and O2 saturations 95% on room air, low suspicion for pneumonia or bronchitis and therefore will defer imaging, symptoms have been present for 3 weeks we will defer viral testing and antibiotic prescribed, doxycycline sent to pharmacy as patient has had success with this medicine in the past, recommended additional over-the-counter medications to be used for supportive care, may follow-up with urgent care as needed Final Clinical Impressions(s) / UC Diagnoses   Final diagnoses:  None   Discharge Instructions   None    ED Prescriptions   None    PDMP not reviewed this encounter.   , NP 12/22/21 1052

## 2021-12-22 NOTE — ED Triage Notes (Signed)
Pt c/o nasal drainage, congestion, cough x3weeks

## 2021-12-22 NOTE — Discharge Instructions (Signed)
On exam there is congestion in your nose and there is some redness to your throat without Ziyon Soltau patches, your lungs are clear and your heart is beating in a regular pace, as your symptoms have been present for 3 weeks there is most likely bacteria in the upper airways causing your symptoms to prolong  Begin doxycycline every morning and every evening for 10 days, ideally you will begin to see improvement after 48 hours of medicine use, may use any of the following below in addition to help manage her symptoms    You can take Tylenol and/or Ibuprofen as needed for fever reduction and pain relief.   For cough: honey 1/2 to 1 teaspoon (you can dilute the honey in water or another fluid).  You can also use guaifenesin and dextromethorphan for cough. You can use a humidifier for chest congestion and cough.  If you don't have a humidifier, you can sit in the bathroom with the hot shower running.      For sore throat: try warm salt water gargles, cepacol lozenges, throat spray, warm tea or water with lemon/honey, popsicles or ice, or OTC cold relief medicine for throat discomfort.   For congestion: take a daily anti-histamine like Zyrtec, Claritin, and a oral decongestant, such as pseudoephedrine.  You can also use Flonase 1-2 sprays in each nostril daily.   It is important to stay hydrated: drink plenty of fluids (water, gatorade/powerade/pedialyte, juices, or teas) to keep your throat moisturized and help further relieve irritation/discomfort.

## 2021-12-29 ENCOUNTER — Ambulatory Visit (INDEPENDENT_AMBULATORY_CARE_PROVIDER_SITE_OTHER): Payer: Medicare Other

## 2021-12-29 ENCOUNTER — Ambulatory Visit
Admission: EM | Admit: 2021-12-29 | Discharge: 2021-12-29 | Disposition: A | Discharge status: Home / Self Care | Payer: Medicare Other | Attending: Family Medicine | Admitting: Family Medicine

## 2021-12-29 DIAGNOSIS — J069 Acute upper respiratory infection, unspecified: Secondary | ICD-10-CM | POA: Diagnosis present

## 2021-12-29 DIAGNOSIS — H9201 Otalgia, right ear: Secondary | ICD-10-CM | POA: Diagnosis not present

## 2021-12-29 DIAGNOSIS — J029 Acute pharyngitis, unspecified: Secondary | ICD-10-CM | POA: Diagnosis not present

## 2021-12-29 DIAGNOSIS — R059 Cough, unspecified: Secondary | ICD-10-CM | POA: Diagnosis not present

## 2021-12-29 DIAGNOSIS — Z1152 Encounter for screening for COVID-19: Secondary | ICD-10-CM | POA: Diagnosis not present

## 2021-12-29 DIAGNOSIS — R0602 Shortness of breath: Secondary | ICD-10-CM

## 2021-12-29 DIAGNOSIS — R110 Nausea: Secondary | ICD-10-CM | POA: Diagnosis not present

## 2021-12-29 DIAGNOSIS — R0989 Other specified symptoms and signs involving the circulatory and respiratory systems: Secondary | ICD-10-CM | POA: Diagnosis not present

## 2021-12-29 DIAGNOSIS — R0789 Other chest pain: Secondary | ICD-10-CM | POA: Insufficient documentation

## 2021-12-29 DIAGNOSIS — R052 Subacute cough: Secondary | ICD-10-CM | POA: Diagnosis present

## 2021-12-29 LAB — RESP PANEL BY RT-PCR (RSV, FLU A&B, COVID)  RVPGX2
Influenza A by PCR: NEGATIVE
Influenza B by PCR: NEGATIVE
Resp Syncytial Virus by PCR: NEGATIVE
SARS Coronavirus 2 by RT PCR: NEGATIVE

## 2021-12-29 MED ORDER — ALBUTEROL SULFATE HFA 108 (90 BASE) MCG/ACT IN AERS: 1.0000 | INHALATION_SPRAY | Freq: Four times a day (QID) | RESPIRATORY_TRACT | 0 refills | Status: DC | PRN

## 2021-12-29 MED ORDER — PREDNISONE 50 MG PO TABS: ORAL_TABLET | ORAL | 0 refills | Status: DC

## 2021-12-29 MED ORDER — TUSSIONEX PENNKINETIC ER 10-8 MG/5ML PO SUER: 5.0000 mL | Freq: Two times a day (BID) | ORAL | 0 refills | Status: DC | PRN

## 2021-12-29 NOTE — Discharge Instructions (Addendum)
Your chest x-ray did not show a pneumonia.  I sent some steroids to your pharmacy to help clear your inflammation.   We will contact you if your COVID, RSV or influenza test is positive.  Please quarantine while you wait for the results.  If your test is negative you may resume normal activities.  If your test is positive please continue to quarantine for at least 5 days from your symptom onset or until you are without a fever for at least 24 hours after the medications.   You can take Tylenol and/or Ibuprofen as needed for fever reduction and pain relief.    For cough: honey 1/2 to 1 teaspoon (you can dilute the honey in water or another fluid).  You can also use guaifenesin and dextromethorphan for cough. You can use a humidifier for chest congestion and cough.  If you don't have a humidifier, you can sit in the bathroom with the hot shower running.      For sore throat: try warm salt water gargles, cepacol lozenges, throat spray, warm tea or water with lemon/honey, popsicles or ice, or OTC cold relief medicine for throat discomfort.    For congestion: take a daily anti-histamine like Zyrtec, Claritin, and a oral decongestant, such as pseudoephedrine.  You can also use Flonase 1-2 sprays in each nostril daily.    It is important to stay hydrated: drink plenty of fluids (water, gatorade/powerade/pedialyte, juices, or teas) to keep your throat moisturized and help further relieve irritation/discomfort.    Return or go to the Emergency Department if symptoms worsen or do not improve in the next few days

## 2021-12-29 NOTE — ED Provider Notes (Signed)
[facility]    CSN: [id number] Arrival date & time: 12/29/21  0806      History   Chief Complaint Chief Complaint  Patient presents with   Cough   Otalgia    HPI Derrick Haynes is a 66 y.o. male.   HPI   Derrick Haynes presents for ongoing cough for the past 3 to 4 weeks.  Now he has right ear pain.  He has a productive cough of white sputum.  No history of asthma or COPD.  He has never smoked in his life.  He was seen last week and given doxycycline.  Status at that did not help.  Of note, his wife was diagnosed with influenza earlier this week.  He has not had a fever, no myalgias, no sinus pressure, no headache, no vomiting, no diarrhea. Endorses a scratchy sore throat, nasal congestion, nausea, shortness of breath and chest tightness.  Symptoms are worse at night.  Eating and drinking well.     Past Medical History:  Diagnosis Date   GERD (gastroesophageal reflux disease)    Plantar fasciitis     Patient Active Problem List   Diagnosis Date Noted   Acute URI 08/10/2021   Acute cough 08/10/2021    Past Surgical History:  Procedure Laterality Date   BACK SURGERY     EYE SURGERY  10/2021   NASAL SINUS SURGERY         Home Medications    Prior to Admission medications   Medication Sig Start Date End Date Taking? Authorizing Provider  atorvastatin (LIPITOR) 20 MG tablet Take 1 tablet by mouth daily. 05/08/21  Yes [provider]  escitalopram (LEXAPRO) 10 MG tablet Take 10 mg by mouth daily. 07/09/21  Yes [provider]  albuterol (VENTOLIN HFA) 108 (90 Base) MCG/ACT inhaler Inhale 1-2 puffs into the lungs every 6 (six) hours as needed for wheezing or shortness of breath. 12/29/21   Katha Cabal, DO  chlorpheniramine-HYDROcodone (TUSSIONEX PENNKINETIC ER) 10-8 MG/5ML SUER Take 5 mLs by mouth every 12 (twelve) hours as needed. 12/29/21   Katha Cabal, DO  predniSONE (DELTASONE) 50 MG tablet 1 tablet daily x 5 days 12/29/21   Katha Cabal, DO  omeprazole (PRILOSEC) 40 MG capsule  07/08/17 05/06/20  [provider]    Family History Family History  Problem Relation Age of Onset   Cancer Mother    Cancer Father     Social History Social History   Tobacco Use   Smoking status: Never   Smokeless tobacco: Never  Vaping Use   Vaping Use: Never used  Substance Use Topics   Alcohol use: Yes    Comment: rare   Drug use: Never     Allergies   Patient has no known allergies.   Review of Systems Review of Systems: negative unless otherwise stated in HPI.      Physical Exam Triage Vital Signs ED Triage Vitals  Enc Vitals Group     BP 12/29/21 0828 (!) 142/84     Pulse Rate 12/29/21 0828 65     Resp --      Temp 12/29/21 0828 98.1 F (36.7 C)     Temp Source 12/29/21 0828 Oral     SpO2 12/29/21 0828 98 %     Weight 12/29/21 0827 230 lb (104.3 kg)     Height 12/29/21 0827 5\' 9"  (1.753 m)     Head Circumference --      Peak Flow --  Pain Score 12/29/21 0827 4     Pain Loc --      Pain Edu? --      Excl. in [place]? --    No data found.  Updated Vital Signs BP (!) 142/84 (BP Location: Left Arm)   Pulse 65   Temp 98.1 F (36.7 C) (Oral)   Ht 5\' 9"  (1.753 m)   Wt 104.3 kg   SpO2 98%   BMI 33.97 kg/m   Visual Acuity Right Eye Distance:   Left Eye Distance:   Bilateral Distance:    Right Eye Near:   Left Eye Near:    Bilateral Near:     Physical Exam GEN:     alert, non-toxic appearing male in no distress     HENT:  mucus membranes moist, oropharyngeal  without lesions or  exudate, no adenoids or tonsils present, mild oropharyngeal erythema, no nasal discharge,  bilateral TM  normal EYES:   pupils equal and reactive, EOMi ,  no scleral injection NECK:  normal ROM, no  lymphadenopathy RESP:  no increased work of breathing,  clear to auscultation bilaterally CVS:   regular rate  and rhythm Skin:   warm and dry, no rash on visible skin , normal  skin turgor    UC Treatments  / Results  Labs (all labs ordered are listed, but only abnormal results are displayed) Labs Reviewed  RESP PANEL BY RT-PCR (RSV, FLU A&B, COVID)  [id number]    EKG   Radiology DG Chest 2 View  Result Date: 12/29/2021 CLINICAL DATA:  Shortness of breath, cough, and chest congestion for 3-4 weeks. EXAM: CHEST - 2 VIEW COMPARISON:  Chest radiographs 05/06/2020 FINDINGS: The cardiomediastinal silhouette is unchanged with normal heart size. No airspace consolidation, edema, pleural effusion, or pneumothorax is identified. No acute osseous abnormality is seen. IMPRESSION: No active cardiopulmonary disease. Electronically Signed   By: Logan Bores M.D.   On: 12/29/2021 09:40    Procedures Procedures (including critical care time)  Medications Ordered in UC Medications - No data to display  Initial Impression / Assessment and Plan / UC Course  I have reviewed the triage vital signs and the nursing notes.  Pertinent labs & imaging results that were available during my care of the patient were reviewed by me and considered in my medical decision making (see chart for details).       Pt is a 66 y.o. male who presents for 3-4 weeks of  cough with new sore throat, nasal congestion. Derrick Haynes is  afebrile here without recent antipyretics. Satting well on room air. Overall pt is  well appearing, well hydrated, without respiratory distress. Pulmonary exam  is unremarkable.  Recent exposure to influenza with his wife.  RSV, COVID and influenza testing obtained.  Chest xray personally reviewed by me without focal pneumonia, pleural effusion, cardiomegaly or pneumothorax.  Pt to quarantine until COVID test results or longer if positive.  I will call patient with test results, if positive COVID, RSV or influenza result. History consistent with  viral respiratory illness. Discussed symptomatic treatment.  Given prednisone burst to help with lung inflammation. Doxycycline did not provide relief of symptoms.   Refilled albuterol inhaler.  Provided cough syrup to help with nighttime cough.  Typical duration of symptoms discussed.  Return and ED precautions given and patient voiced understanding.  Discussed MDM, treatment plan and plan for follow-up with patient who agrees with plan.     Final Clinical Impressions(s) / UC Diagnoses  Final diagnoses:  Subacute cough  Viral URI with cough     Discharge Instructions      Your chest x-ray did not show a pneumonia.  I sent some steroids to your pharmacy to help clear your inflammation.   We will contact you if your COVID, RSV or influenza test is positive.  Please quarantine while you wait for the results.  If your test is negative you may resume normal activities.  If your test is positive please continue to quarantine for at least 5 days from your symptom onset or until you are without a fever for at least 24 hours after the medications.   You can take Tylenol and/or Ibuprofen as needed for fever reduction and pain relief.    For cough: honey 1/2 to 1 teaspoon (you can dilute the honey in water or another fluid).  You can also use guaifenesin and dextromethorphan for cough. You can use a humidifier for chest congestion and cough.  If you don't have a humidifier, you can sit in the bathroom with the hot shower running.      For sore throat: try warm salt water gargles, cepacol lozenges, throat spray, warm tea or water with lemon/honey, popsicles or ice, or OTC cold relief medicine for throat discomfort.    For congestion: take a daily anti-histamine like Zyrtec, Claritin, and a oral decongestant, such as pseudoephedrine.  You can also use Flonase 1-2 sprays in each nostril daily.    It is important to stay hydrated: drink plenty of fluids (water, gatorade/powerade/pedialyte, juices, or teas) to keep your throat moisturized and help further relieve irritation/discomfort.    Return or go to the Emergency Department if symptoms worsen or do not  improve in the next few days      ED Prescriptions     Medication Sig Dispense Auth. Provider   albuterol (VENTOLIN HFA) 108 (90 Base) MCG/ACT inhaler Inhale 1-2 puffs into the lungs every 6 (six) hours as needed for wheezing or shortness of breath. 18 g Katha Cabal, DO   chlorpheniramine-HYDROcodone (TUSSIONEX PENNKINETIC ER) 10-8 MG/5ML SUER Take 5 mLs by mouth every 12 (twelve) hours as needed. 115 mL Birdie Beveridge, DO   predniSONE (DELTASONE) 50 MG tablet 1 tablet daily x 5 days 5 tablet Nieve Rojero, DO      I have reviewed the PDMP during this encounter.   Katha Cabal, DO 12/29/21 [phone]

## 2021-12-29 NOTE — ED Triage Notes (Signed)
Pt c/o chest congestion, cough, RT ear ache x3-4 weeks. Pt states nothing has changed since he was seen.

## 2022-08-23 ENCOUNTER — Ambulatory Visit (INDEPENDENT_AMBULATORY_CARE_PROVIDER_SITE_OTHER): Payer: Medicare Other

## 2022-08-23 ENCOUNTER — Ambulatory Visit
Admission: EM | Admit: 2022-08-23 | Discharge: 2022-08-23 | Disposition: A | Discharge status: Home / Self Care | Payer: Medicare Other | Attending: Physician Assistant | Admitting: Physician Assistant

## 2022-08-23 DIAGNOSIS — R051 Acute cough: Secondary | ICD-10-CM

## 2022-08-23 DIAGNOSIS — R0602 Shortness of breath: Secondary | ICD-10-CM

## 2022-08-23 DIAGNOSIS — B349 Viral infection, unspecified: Secondary | ICD-10-CM

## 2022-08-23 MED ORDER — PROMETHAZINE-DM 6.25-15 MG/5ML PO SYRP: 5.0000 mL | ORAL_SOLUTION | Freq: Four times a day (QID) | ORAL | 0 refills | Status: DC | PRN

## 2022-08-23 NOTE — ED Provider Notes (Signed)
[facility]    CSN: [id number] Arrival date & time: 08/23/22  1027      History   Chief Complaint Chief Complaint  Patient presents with   Cough    HPI Derrick Haynes is a 67 y.o. male presenting for fatigue, productive cough (clear sputum), nasal congestion, chest pain on coughing, wheezing/shortness of breath, body aches x 5 days.  Patient reports his wife is also sick and was seen here last weekend. She was treated for viral illness.  He has not had any fevers and no report of ear pain, sinus pain, vomiting or diarrhea.  Reports cough becomes worse at night.  He says he has not been taking over-the-counter medication for symptoms.  Denies any history of heart or lung disease.  HPI  Past Medical History:  Diagnosis Date   GERD (gastroesophageal reflux disease)    Plantar fasciitis     Patient Active Problem List   Diagnosis Date Noted   Acute URI 08/10/2021   Acute cough 08/10/2021    Past Surgical History:  Procedure Laterality Date   BACK SURGERY     EYE SURGERY  10/2021   NASAL SINUS SURGERY         Home Medications    Prior to Admission medications   Medication Sig Start Date End Date Taking? Authorizing Provider  albuterol (VENTOLIN HFA) 108 (90 Base) MCG/ACT inhaler Inhale 1-2 puffs into the lungs every 6 (six) hours as needed for wheezing or shortness of breath. 12/29/21  Yes Brimage, Vondra, DO  atorvastatin (LIPITOR) 20 MG tablet Take 1 tablet by mouth daily. 05/08/21  Yes [provider]  escitalopram (LEXAPRO) 10 MG tablet Take 10 mg by mouth daily. 07/09/21  Yes [provider]  promethazine-dextromethorphan (PROMETHAZINE-DM) 6.25-15 MG/5ML syrup Take 5 mLs by mouth 4 (four) times daily as needed. 08/23/22  Yes Shirlee Latch, PA-C  omeprazole (PRILOSEC) 40 MG capsule  07/08/17 05/06/20  [provider]    Family History Family History  Problem Relation Age of Onset   Cancer Mother    Cancer Father      Social History Social History   Tobacco Use   Smoking status: Never   Smokeless tobacco: Never  Vaping Use   Vaping Use: Never used  Substance Use Topics   Alcohol use: Yes    Comment: rare   Drug use: Never     Allergies   Patient has no known allergies.   Review of Systems Review of Systems  Constitutional:  Positive for fatigue. Negative for fever.  HENT:  Positive for congestion, postnasal drip and rhinorrhea. Negative for ear pain, sinus pressure, sinus pain and sore throat.   Respiratory:  Positive for cough, shortness of breath and wheezing.   Cardiovascular:  Negative for chest pain.  Gastrointestinal:  Negative for abdominal pain, diarrhea, nausea and vomiting.  Musculoskeletal:  Positive for myalgias.  Neurological:  Negative for weakness, light-headedness and headaches.  Hematological:  Negative for adenopathy.     Physical Exam Triage Vital Signs ED Triage Vitals  Enc Vitals Group     BP 08/23/22 1038 (!) 143/86     Pulse Rate 08/23/22 1038 67     Resp 08/23/22 1038 16     Temp 08/23/22 1038 98.1 F (36.7 C)     Temp Source 08/23/22 1038 Oral     SpO2 08/23/22 1038 94 %     Weight 08/23/22 1038 234 lb (106.1 kg)     Height 08/23/22  1038 5\' 9"  (1.753 m)     Head Circumference --      Peak Flow --      Pain Score 08/23/22 1037 8     Pain Loc --      Pain Edu? --      Excl. in GC? --    No data found.  Updated Vital Signs BP (!) 143/86 (BP Location: Right Arm)   Pulse 67   Temp 98.1 F (36.7 C) (Oral)   Resp 16   Ht 5\' 9"  (1.753 m)   Wt 234 lb (106.1 kg)   SpO2 94%   BMI 34.56 kg/m       Physical Exam Vitals and nursing note reviewed.  Constitutional:      General: He is not in acute distress.    Appearance: Normal appearance. He is well-developed. He is not ill-appearing.  HENT:     Head: Normocephalic and atraumatic.     Nose: Congestion present.     Mouth/Throat:     Mouth: Mucous membranes are moist.     Pharynx:  Oropharynx is clear.  Eyes:     General: No scleral icterus.    Conjunctiva/sclera: Conjunctivae normal.  Cardiovascular:     Rate and Rhythm: Normal rate and regular rhythm.  Pulmonary:     Effort: Pulmonary effort is normal. No respiratory distress.     Breath sounds: Wheezing present.  Musculoskeletal:        General: No swelling.     Cervical back: Neck supple.  Skin:    General: Skin is warm and dry.     Capillary Refill: Capillary refill takes less than 2 seconds.  Neurological:     Mental Status: He is alert.  Psychiatric:        Mood and Affect: Mood normal.      UC Treatments / Results  Labs (all labs ordered are listed, but only abnormal results are displayed) Labs Reviewed - No data to display  EKG   Radiology DG Chest 2 View  Result Date: 08/23/2022 CLINICAL DATA:  Cough with chest congestion and wheezing for 4-5 days. EXAM: CHEST - 2 VIEW COMPARISON:  Radiographs 12/29/2021 and 05/06/2020. FINDINGS: The heart size and mediastinal contours are normal. The lungs are clear. There is no pleural effusion or pneumothorax. No acute osseous findings are identified. Stable mild degenerative changes in the spine. IMPRESSION: Stable chest. No active cardiopulmonary process. Electronically Signed   By: Carey Bullocks M.D.   On: 08/23/2022 11:32    Procedures Procedures (including critical care time)  Medications Ordered in UC Medications - No data to display  Initial Impression / Assessment and Plan / UC Course  I have reviewed the triage vital signs and the nursing notes.  Pertinent labs & imaging results that were available during my care of the patient were reviewed by me and considered in my medical decision making (see chart for details).   67 year old male presents for 5-day history of productive cough, congestion, wheezing, postnasal drainage and bodyaches.  Patient is afebrile.  Oxygen 94%.  No acute distress.  On exam he has nasal congestion.  Throat is  clear.  Chest clear to auscultation.  X-ray of chest obtained to assess for possible underlying pneumonia given his symptoms.  Reviewed x-ray results with patient.   67 year old male presents for cough, congestion, fatigue, chest pressure and shortness of breath x 1 week, worsening over the past few days.  No sick contacts.  Has tried OTC cough  meds without relief.   Vitals are stable.  She is afebrile.  Ill appearing but non toxic.  Patient has deep forceful cough. Chest is clear.    Chest x-ray obtained to assess for possible underlying pneumonia. X-ray is negative.    Reviewed with patient his symptoms are consistent with viral illness.  Supportive care encouraged. Encouraged increasing rest and fluids. Sent promethazine DM.  Reviewed that his symptoms may last for couple weeks but should be improving soon.  Advised to return if fever or worsening, shortness of breath that is worsening, weakness, etc.   Final Clinical Impressions(s) / UC Diagnoses   Final diagnoses:  Viral illness  Acute cough  Shortness of breath     Discharge Instructions      URI/COLD SYMPTOMS: X-ray is normal. Your exam today is consistent with a viral illness. Antibiotics are not indicated at this time. Use medications as directed, including cough syrup, nasal saline, and decongestants. Your symptoms should improve over the next few days and resolve within 7-10 days. Increase rest and fluids. F/u if symptoms worsen or predominate such as sore throat, ear pain, productive cough, shortness of breath, or if you develop high fevers or worsening fatigue over the next several days.       ED Prescriptions     Medication Sig Dispense Auth. Provider   promethazine-dextromethorphan (PROMETHAZINE-DM) 6.25-15 MG/5ML syrup Take 5 mLs by mouth 4 (four) times daily as needed. 118 mL Shirlee Latch, PA-C      PDMP not reviewed this encounter.   Shirlee Latch, PA-C 08/23/22 1147

## 2022-08-23 NOTE — ED Triage Notes (Signed)
Patient here today with c/o productive cough and head and chest congestion X 5 days. He is also c/o wheeze and PND. Yesterday he started feeling achy. Coughing is worse at night while he is lying down. He has been taking Tylenol with no relief. His wife has the same symptoms.

## 2022-08-23 NOTE — Discharge Instructions (Signed)
URI/COLD SYMPTOMS: X-ray is normal. Your exam today is consistent with a viral illness. Antibiotics are not indicated at this time. Use medications as directed, including cough syrup, nasal saline, and decongestants. Your symptoms should improve over the next few days and resolve within 7-10 days. Increase rest and fluids. F/u if symptoms worsen or predominate such as sore throat, ear pain, productive cough, shortness of breath, or if you develop high fevers or worsening fatigue over the next several days.

## 2022-12-27 ENCOUNTER — Ambulatory Visit
Admission: EM | Admit: 2022-12-27 | Discharge: 2022-12-27 | Disposition: A | Discharge status: Home / Self Care | Payer: Medicare Other | Attending: Internal Medicine | Admitting: Internal Medicine

## 2022-12-27 DIAGNOSIS — Z1152 Encounter for screening for COVID-19: Secondary | ICD-10-CM | POA: Insufficient documentation

## 2022-12-27 DIAGNOSIS — J209 Acute bronchitis, unspecified: Secondary | ICD-10-CM | POA: Insufficient documentation

## 2022-12-27 LAB — SARS CORONAVIRUS 2 BY RT PCR: SARS Coronavirus 2 by RT PCR: NEGATIVE

## 2022-12-27 MED ORDER — HYDROCOD POLI-CHLORPHE POLI ER 10-8 MG/5ML PO SUER: 5.0000 mL | Freq: Two times a day (BID) | ORAL | 0 refills | Status: DC | PRN

## 2022-12-27 MED ORDER — PREDNISONE 20 MG PO TABS: 20.0000 mg | ORAL_TABLET | Freq: Every day | ORAL | 0 refills | Status: AC

## 2022-12-27 MED ORDER — GUAIFENESIN ER 600 MG PO TB12: 600.0000 mg | ORAL_TABLET | Freq: Two times a day (BID) | ORAL | 0 refills | Status: AC

## 2022-12-27 MED ORDER — ALBUTEROL SULFATE HFA 108 (90 BASE) MCG/ACT IN AERS: 1.0000 | INHALATION_SPRAY | Freq: Four times a day (QID) | RESPIRATORY_TRACT | 0 refills | Status: DC | PRN

## 2022-12-27 NOTE — ED Provider Notes (Signed)
[facility]    CSN: [id number] Arrival date & time: 12/27/22  1010      History   Chief Complaint Chief Complaint  Patient presents with   Cough   Fever   Sore Throat    HPI Derrick Haynes is a 67 y.o. male comes to the urgent care with 3 days history of nonproductive cough, sore throat and subjective fever.  Patient recently returned from [facility] on vacation.  On his flight back home he was sitting close to a couple of children who had runny nose and cough.  He denies any shortness of breath.  He endorses generalized bodyaches no nausea, vomiting or diarrhea.  Patient endorses postnasal drainage with some chest tightness as well as wheezing.  No history of tobacco use.  No dizziness, near-syncope or syncopal episodes.  Patient has headaches.   HPI  Past Medical History:  Diagnosis Date   GERD (gastroesophageal reflux disease)    Plantar fasciitis     Patient Active Problem List   Diagnosis Date Noted   Acute URI 08/10/2021   Acute cough 08/10/2021    Past Surgical History:  Procedure Laterality Date   BACK SURGERY     EYE SURGERY  10/2021   NASAL SINUS SURGERY         Home Medications    Prior to Admission medications   Medication Sig Start Date End Date Taking? Authorizing Provider  atorvastatin (LIPITOR) 20 MG tablet Take 1 tablet by mouth daily. 05/08/21  Yes [provider]  chlorpheniramine-HYDROcodone (TUSSIONEX) 10-8 MG/5ML Take 5 mLs by mouth every 12 (twelve) hours as needed for cough. 12/27/22  Yes Shanell Aden, Britta Mccreedy, MD  guaiFENesin (MUCINEX) 600 MG 12 hr tablet Take 1 tablet (600 mg total) by mouth 2 (two) times daily for 10 days. 12/27/22 01/06/23 Yes Yao Hyppolite, Britta Mccreedy, MD  predniSONE (DELTASONE) 20 MG tablet Take 1 tablet (20 mg total) by mouth daily for 5 days. 12/27/22 01/01/23 Yes Davan Hark, Britta Mccreedy, MD  albuterol (VENTOLIN HFA) 108 (90 Base) MCG/ACT inhaler Inhale 1-2 puffs into the lungs every 6 (six) hours as needed  for wheezing or shortness of breath. 12/27/22   Merrilee Jansky, MD  escitalopram (LEXAPRO) 10 MG tablet Take 10 mg by mouth daily. 07/09/21   [provider]  omeprazole (PRILOSEC) 40 MG capsule  07/08/17 05/06/20  [provider]    Family History Family History  Problem Relation Age of Onset   Cancer Mother    Cancer Father     Social History Social History   Tobacco Use   Smoking status: Never   Smokeless tobacco: Never  Vaping Use   Vaping status: Never Used  Substance Use Topics   Alcohol use: Yes    Comment: rare   Drug use: Never     Allergies   Patient has no known allergies.   Review of Systems Review of Systems As per HPI  Physical Exam Triage Vital Signs ED Triage Vitals [12/27/22 1021]  Encounter Vitals Group     BP (!) 154/79     Systolic BP Percentile      Diastolic BP Percentile      Pulse Rate 73     Resp 18     Temp 98.7 F (37.1 C)     Temp Source Oral     SpO2 94 %     Weight      Height      Head Circumference  Peak Flow      Pain Score 0     Pain Loc      Pain Education      Exclude from Growth Chart    No data found.  Updated Vital Signs BP (!) 154/79 (BP Location: Right Arm)   Pulse 73   Temp 98.7 F (37.1 C) (Oral)   Resp 18   SpO2 94%   Visual Acuity Right Eye Distance:   Left Eye Distance:   Bilateral Distance:    Right Eye Near:   Left Eye Near:    Bilateral Near:     Physical Exam Vitals and nursing note reviewed.  Constitutional:      General: He is not in acute distress.    Appearance: He is not ill-appearing.  HENT:     Right Ear: Tympanic membrane normal.     Left Ear: Tympanic membrane normal.     Mouth/Throat:     Mouth: Mucous membranes are moist.     Pharynx: Uvula midline. Posterior oropharyngeal erythema present.     Tonsils: No tonsillar exudate or tonsillar abscesses.  Cardiovascular:     Rate and Rhythm: Normal rate and regular rhythm.     Heart sounds: Normal heart  sounds.  Pulmonary:     Effort: Pulmonary effort is normal.     Breath sounds: Normal breath sounds.  Abdominal:     General: Bowel sounds are normal.     Palpations: Abdomen is soft.  Neurological:     Mental Status: He is alert.      UC Treatments / Results  Labs (all labs ordered are listed, but only abnormal results are displayed) Labs Reviewed  SARS CORONAVIRUS 2 BY RT PCR    EKG   Radiology No results found.  Procedures Procedures (including critical care time)  Medications Ordered in UC Medications - No data to display  Initial Impression / Assessment and Plan / UC Course  I have reviewed the triage vital signs and the nursing notes.  Pertinent labs & imaging results that were available during my care of the patient were reviewed by me and considered in my medical decision making (see chart for details).     1.  Acute viral respiratory infection with bronchospasm: Patient is advised to increase oral fluid intake Albuterol inhaler as needed for wheezing Tussionex twice daily as needed for cough Short course of prednisone Mucinex 600 mg twice daily COVID-19 testing was negative Patient is advised to return to urgent care if symptoms worsen. Final Clinical Impressions(s) / UC Diagnoses   Final diagnoses:  Acute bronchitis with bronchospasm     Discharge Instructions      Please increase oral fluid intake Please take medications as prescribed Will call you with recommendations if your COVID test is positive You may take Tylenol as needed for fever Please feel free to return to urgent care if symptoms worsen.    ED Prescriptions     Medication Sig Dispense Auth. Provider   chlorpheniramine-HYDROcodone (TUSSIONEX) 10-8 MG/5ML Take 5 mLs by mouth every 12 (twelve) hours as needed for cough. 115 mL Anzleigh Slaven, Britta Mccreedy, MD   predniSONE (DELTASONE) 20 MG tablet Take 1 tablet (20 mg total) by mouth daily for 5 days. 5 tablet Dawson Albers, Britta Mccreedy, MD    guaiFENesin (MUCINEX) 600 MG 12 hr tablet Take 1 tablet (600 mg total) by mouth 2 (two) times daily for 10 days. 20 tablet Yanky Vanderburg, Britta Mccreedy, MD   albuterol (VENTOLIN HFA) 108 (90  Base) MCG/ACT inhaler Inhale 1-2 puffs into the lungs every 6 (six) hours as needed for wheezing or shortness of breath. 18 g Elishua Radford, Britta Mccreedy, MD      I have reviewed the PDMP during this encounter.   Merrilee Jansky, MD 12/27/22 1254

## 2022-12-27 NOTE — Discharge Instructions (Addendum)
Please increase oral fluid intake Please take medications as prescribed Will call you with recommendations if your COVID test is positive You may take Tylenol as needed for fever Please feel free to return to urgent care if symptoms worsen.

## 2022-12-27 NOTE — ED Triage Notes (Signed)
Patient states that he's been sick for a few days.   Runny nose,chest congestion, body aches,sore throat, cough

## 2022-12-31 NOTE — Plan of Care (Signed)
[facility] Tonsillectomy/Adenoidectomy, Postoperative PEDS care plan entered in error.

## 2023-08-30 ENCOUNTER — Ambulatory Visit (INDEPENDENT_AMBULATORY_CARE_PROVIDER_SITE_OTHER)

## 2023-08-30 ENCOUNTER — Ambulatory Visit
Admission: EM | Admit: 2023-08-30 | Discharge: 2023-08-30 | Disposition: A | Discharge status: Home / Self Care | Attending: Physician Assistant

## 2023-08-30 ENCOUNTER — Ambulatory Visit (HOSPITAL_COMMUNITY): Payer: Self-pay

## 2023-08-30 VITALS — BP 156/85 | HR 56 | Temp 98.5°F | Resp 15 | Ht 69.0 in | Wt 233.9 lb

## 2023-08-30 DIAGNOSIS — R0981 Nasal congestion: Secondary | ICD-10-CM | POA: Diagnosis not present

## 2023-08-30 DIAGNOSIS — R062 Wheezing: Secondary | ICD-10-CM

## 2023-08-30 DIAGNOSIS — J209 Acute bronchitis, unspecified: Secondary | ICD-10-CM | POA: Diagnosis not present

## 2023-08-30 DIAGNOSIS — R0602 Shortness of breath: Secondary | ICD-10-CM

## 2023-08-30 DIAGNOSIS — R051 Acute cough: Secondary | ICD-10-CM | POA: Diagnosis not present

## 2023-08-30 DIAGNOSIS — R030 Elevated blood-pressure reading, without diagnosis of hypertension: Secondary | ICD-10-CM

## 2023-08-30 MED ORDER — PREDNISONE 20 MG PO TABS: 40.0000 mg | ORAL_TABLET | Freq: Every day | ORAL | 0 refills | Status: AC

## 2023-08-30 MED ORDER — PSEUDOEPH-BROMPHEN-DM 30-2-10 MG/5ML PO SYRP: 10.0000 mL | ORAL_SOLUTION | Freq: Four times a day (QID) | ORAL | 0 refills | Status: AC | PRN

## 2023-08-30 MED ORDER — HYDROCOD POLI-CHLORPHE POLI ER 10-8 MG/5ML PO SUER: 5.0000 mL | Freq: Every evening | ORAL | 0 refills | Status: DC | PRN

## 2023-08-30 MED ORDER — IPRATROPIUM BROMIDE 0.06 % NA SOLN: 2.0000 | Freq: Four times a day (QID) | NASAL | 0 refills | Status: DC

## 2023-08-30 NOTE — ED Triage Notes (Signed)
 Patient c/o sinus congestion and pressure, cough, chest congestion, and headaches that started last Saturday.  Patient denies fevers.

## 2023-08-30 NOTE — Discharge Instructions (Addendum)
-  I did not see pneumonia on your xray but if radiologist sees something abnormal we will contact you and send antibiotics -Bronchitis causes symptoms that can last for a few weeks sometimes. - Continue with nasal saline rinses. - I sent a cough medication and nasal spray to the pharmacy as well as prednisone . - Return if fever or worsening symptoms or if you not feeling better in a couple more weeks.

## 2023-08-30 NOTE — ED Provider Notes (Signed)
 [facility]    CSN: [id number] Arrival date & time: 08/30/23  0803      History   Chief Complaint Chief Complaint  Patient presents with   Cough   Otalgia    HPI Derrick Haynes is a 68 y.o. male presenting for 6 day history of cough, congestion, sinus pressure, sore throat, and headaches. Reports mild shortness of breath. Denies fever, ear pain, chest pain.  Patient says he has been taking Mucinex  and using nasal sprays without relief.  Reports cough keeps him up at night.  Patient does report that his granddaughter was sick shortly before he became ill.  Medical history significant for chronic sinusitis and previous nasal sinus surgery.  Patient is a non-smoker and has no history of asthma or COPD.  HPI  Past Medical History:  Diagnosis Date   GERD (gastroesophageal reflux disease)    Plantar fasciitis     Patient Active Problem List   Diagnosis Date Noted   Acute URI 08/10/2021   Acute cough 08/10/2021    Past Surgical History:  Procedure Laterality Date   BACK SURGERY     EYE SURGERY  10/2021   NASAL SINUS SURGERY         Home Medications    Prior to Admission medications   Medication Sig Start Date End Date Taking? Authorizing Provider  atorvastatin (LIPITOR) 20 MG tablet Take 1 tablet by mouth daily. 05/08/21  Yes [provider]  brompheniramine-pseudoephedrine-DM 30-2-10 MG/5ML syrup Take 10 mLs by mouth 4 (four) times daily as needed for up to 7 days. 08/30/23 09/06/23 Yes Floydene Hy, PA-C  chlorpheniramine-HYDROcodone (TUSSIONEX) 10-8 MG/5ML Take 5 mLs by mouth at bedtime as needed for cough. 08/30/23  Yes Nancy Axon B, PA-C  escitalopram (LEXAPRO) 10 MG tablet Take 10 mg by mouth daily. 07/09/21  Yes [provider]  ipratropium (ATROVENT) 0.06 % nasal spray Place 2 sprays into both nostrils 4 (four) times daily. 08/30/23  Yes Floydene Hy, PA-C  predniSONE  (DELTASONE ) 20 MG tablet Take 2 tablets (40 mg total) by  mouth daily for 5 days. 08/30/23 09/04/23 Yes Floydene Hy, PA-C  albuterol  (VENTOLIN  HFA) 108 (90 Base) MCG/ACT inhaler Inhale 1-2 puffs into the lungs every 6 (six) hours as needed for wheezing or shortness of breath. 12/27/22   Corine Dice, MD  omeprazole (PRILOSEC) 40 MG capsule  07/08/17 05/06/20  [provider]    Family History Family History  Problem Relation Age of Onset   Cancer Mother    Cancer Father     Social History Social History   Tobacco Use   Smoking status: Never   Smokeless tobacco: Never  Vaping Use   Vaping status: Never Used  Substance Use Topics   Alcohol use: Yes    Comment: rare   Drug use: Never     Allergies   Patient has no known allergies.   Review of Systems Review of Systems  Constitutional:  Positive for fatigue. Negative for fever.  HENT:  Positive for congestion, rhinorrhea, sinus pressure and sore throat. Negative for ear pain and sinus pain.   Respiratory:  Positive for cough and shortness of breath.   Cardiovascular:  Negative for chest pain.  Gastrointestinal:  Negative for abdominal pain, diarrhea, nausea and vomiting.  Musculoskeletal:  Negative for myalgias.  Neurological:  Negative for weakness, light-headedness and headaches.  Hematological:  Negative for adenopathy.  Psychiatric/Behavioral:  Positive for sleep disturbance.  Physical Exam Triage Vital Signs ED Triage Vitals  Encounter Vitals Group     BP      Girls Systolic BP Percentile      Girls Diastolic BP Percentile      Boys Systolic BP Percentile      Boys Diastolic BP Percentile      Pulse      Resp      Temp      Temp src      SpO2      Weight      Height      Head Circumference      Peak Flow      Pain Score      Pain Loc      Pain Education      Exclude from Growth Chart    No data found.  Updated Vital Signs BP (!) 156/85 (BP Location: Right Arm)   Pulse (!) 56   Temp 98.5 F (36.9 C) (Oral)   Resp 15   Ht 5' 9  (1.753 m)   Wt 233 lb 14.5 oz (106.1 kg)   SpO2 95%   BMI 34.54 kg/m     Physical Exam Vitals and nursing note reviewed.  Constitutional:      General: He is not in acute distress.    Appearance: Normal appearance. He is well-developed. He is not ill-appearing.  HENT:     Head: Normocephalic and atraumatic.     Right Ear: Tympanic membrane, ear canal and external ear normal.     Left Ear: Tympanic membrane, ear canal and external ear normal.     Nose: Congestion present.     Mouth/Throat:     Mouth: Mucous membranes are moist.     Pharynx: Oropharynx is clear.   Eyes:     General: No scleral icterus.    Conjunctiva/sclera: Conjunctivae normal.    Cardiovascular:     Rate and Rhythm: Regular rhythm. Bradycardia present.  Pulmonary:     Effort: Pulmonary effort is normal. No respiratory distress.     Breath sounds: Wheezing (throughout right lung) and rhonchi (throughout right lung) present.   Musculoskeletal:     Cervical back: Neck supple.   Skin:    General: Skin is warm and dry.     Capillary Refill: Capillary refill takes less than 2 seconds.   Neurological:     General: No focal deficit present.     Mental Status: He is alert. Mental status is at baseline.     Motor: No weakness.     Gait: Gait normal.   Psychiatric:        Mood and Affect: Mood normal.        Behavior: Behavior normal.      UC Treatments / Results  Labs (all labs ordered are listed, but only abnormal results are displayed) Labs Reviewed - No data to display  EKG   Radiology DG Chest 2 View Result Date: 08/30/2023 CLINICAL DATA:  Cough, congestion and shortness of breath EXAM: CHEST - 2 VIEW COMPARISON:  X-ray 08/23/2022 and older FINDINGS: No consolidation, pneumothorax or effusion. No edema. Normal cardiopericardial silhouette. Degenerative changes of the spine. IMPRESSION: No acute cardiopulmonary disease. Electronically Signed   By: Adrianna Horde M.D.   On: 08/30/2023 10:33     Procedures Procedures (including critical care time)  Medications Ordered in UC Medications - No data to display  Initial Impression / Assessment and Plan / UC Course  I have reviewed the  triage vital signs and the nursing notes.  Pertinent labs & imaging results that were available during my care of the patient were reviewed by me and considered in my medical decision making (see chart for details).   68 y/o male presents for cough, congestion, sinus pressure, sore throat, and shortness of breath x 1 week.  Patient afebrile.  Overall well-appearing.  On exam has nasal congestion.  Throat is clear.  Scattered rhonchi throughout the right lung upper and lower lung fields.  Chest x-ray obtained to evaluate for possible pneumonia.  Wet read negative.  BP elevated at 156/85. Should keep BP log and f/u with PCP if consistent readings >140/90.  Acute bronchitis.  Sent Bromfed-DM for during the day and Tussionex for at bedtime.  Patient asked for Tussionex.  Explained to him that we would do a short supply at bedtime only for severe cough.  No refills to be provided.  Also sent Atrovent nasal spray and prednisone .  Will send antibiotics if over read is positive for pneumonia.  Otherwise explained bronchitis symptoms can last for a few weeks and care is mostly supportive.  Discussed returning for fever, worsening symptoms or if he is not feeling better in a couple more weeks.  Overead on x-ray is negative.    Final Clinical Impressions(s) / UC Diagnoses   Final diagnoses:  Wheezing  Acute bronchitis, unspecified organism  Acute cough  Sinus congestion  Shortness of breath  Elevated blood pressure reading in office without diagnosis of hypertension     Discharge Instructions      -I did not see pneumonia on your xray but if radiologist sees something abnormal we will contact you and send antibiotics -Bronchitis causes symptoms that can last for a few weeks sometimes. - Continue  with nasal saline rinses. - I sent a cough medication and nasal spray to the pharmacy as well as prednisone . - Return if fever or worsening symptoms or if you not feeling better in a couple more weeks.     ED Prescriptions     Medication Sig Dispense Auth. Provider   brompheniramine-pseudoephedrine-DM 30-2-10 MG/5ML syrup Take 10 mLs by mouth 4 (four) times daily as needed for up to 7 days. 150 mL Nancy Axon B, PA-C   ipratropium (ATROVENT) 0.06 % nasal spray Place 2 sprays into both nostrils 4 (four) times daily. 15 mL Nancy Axon B, PA-C   predniSONE  (DELTASONE ) 20 MG tablet Take 2 tablets (40 mg total) by mouth daily for 5 days. 10 tablet Floydene Hy, PA-C   chlorpheniramine-HYDROcodone (TUSSIONEX) 10-8 MG/5ML Take 5 mLs by mouth at bedtime as needed for cough. 50 mL Floydene Hy, PA-C      I have reviewed the PDMP during this encounter.   Floydene Hy, PA-C 08/30/23 1046

## 2023-09-07 ENCOUNTER — Ambulatory Visit
Admission: RE | Admit: 2023-09-07 | Discharge: 2023-09-07 | Disposition: A | Discharge status: Home / Self Care | Payer: Self-pay | Source: Ambulatory Visit | Attending: Emergency Medicine | Admitting: Emergency Medicine

## 2023-09-07 VITALS — BP 144/88 | HR 79 | Temp 98.6°F | Resp 17

## 2023-09-07 DIAGNOSIS — J0140 Acute pansinusitis, unspecified: Secondary | ICD-10-CM

## 2023-09-07 DIAGNOSIS — R051 Acute cough: Secondary | ICD-10-CM

## 2023-09-07 MED ORDER — ALBUTEROL SULFATE HFA 108 (90 BASE) MCG/ACT IN AERS: 1.0000 | INHALATION_SPRAY | Freq: Four times a day (QID) | RESPIRATORY_TRACT | 0 refills | Status: AC | PRN

## 2023-09-07 MED ORDER — IPRATROPIUM BROMIDE 0.06 % NA SOLN: 2.0000 | Freq: Four times a day (QID) | NASAL | 0 refills | Status: DC

## 2023-09-07 MED ORDER — BENZONATATE 100 MG PO CAPS: 200.0000 mg | ORAL_CAPSULE | Freq: Three times a day (TID) | ORAL | 0 refills | Status: AC

## 2023-09-07 MED ORDER — AEROCHAMBER MV MISC: 2 refills | Status: AC

## 2023-09-07 MED ORDER — PROMETHAZINE-DM 6.25-15 MG/5ML PO SYRP: 5.0000 mL | ORAL_SOLUTION | Freq: Four times a day (QID) | ORAL | 0 refills | Status: AC | PRN

## 2023-09-07 MED ORDER — AMOXICILLIN-POT CLAVULANATE 875-125 MG PO TABS: 1.0000 | ORAL_TABLET | Freq: Two times a day (BID) | ORAL | 0 refills | Status: AC

## 2023-09-07 NOTE — ED Triage Notes (Signed)
  Patient c/o sinus congestion and pressure, cough, chest congestion, and headaches  x 2 weeks patient denies fevers.   Patient states tsx have not improved since last visit.

## 2023-09-07 NOTE — Discharge Instructions (Addendum)
 The Augmentin twice daily with food for 10 days for treatment of your sinusitis.  Perform sinus irrigation 2-3 times a day with a NeilMed sinus rinse kit and distilled water.  Do not use tap water.  You can use plain over-the-counter Mucinex  every 6 hours to break up the stickiness of the mucus so your body can clear it.  Increase your oral fluid intake to thin out your mucus so that is also able for your body to clear more easily.  Take an over-the-counter probiotic, such as Culturelle-align-activia, 1 hour after each dose of antibiotic to prevent diarrhea.  Use your albuterol  inhaler, with your spacer, and take 1 to 2 puffs every 4-6 hours as needed for shortness of breath or wheezing.  Use the Atrovent  nasal spray, 2 squirts in each nostril every 6 hours, as needed for runny nose and postnasal drip.  Use the Tessalon  Perles every 8 hours during the day.  Take them with a small sip of water.  They may give you some numbness to the base of your tongue or a metallic taste in your mouth, this is normal.  Use the Promethazine  DM cough syrup at bedtime for cough and congestion.  It will make you drowsy so do not take it during the day.  Return for reevaluation or see your primary care provider for any new or worsening symptoms.

## 2023-09-07 NOTE — ED Provider Notes (Signed)
 [facility]    CSN: [id number] Arrival date & time: 09/07/23  0854      History   Chief Complaint Chief Complaint  Patient presents with   Cough    Entered by patient    HPI Derrick Haynes is a 67 y.o. male.   HPI  68 year old male with past medical history significant for GERD and plantar fasciitis presents for evaluation of 2 weeks worth of respiratory symptoms that include sinus pain and pressure with green nasal discharge, right ear pain, nonproductive cough, shortness breath, and wheezing.  He denies any fever or sore throat.  He was seen in this urgent care 2 weeks ago and diagnosed with bronchitis and was prescribed Bromfed-DM, Tussidex, and prednisone  without improvement of symptoms.  Past Medical History:  Diagnosis Date   GERD (gastroesophageal reflux disease)    Plantar fasciitis     Patient Active Problem List   Diagnosis Date Noted   Acute URI 08/10/2021   Acute cough 08/10/2021    Past Surgical History:  Procedure Laterality Date   BACK SURGERY     EYE SURGERY  10/2021   NASAL SINUS SURGERY         Home Medications    Prior to Admission medications   Medication Sig Start Date End Date Taking? Authorizing Provider  amoxicillin-clavulanate (AUGMENTIN) 875-125 MG tablet Take 1 tablet by mouth every 12 (twelve) hours for 10 days. 09/07/23 09/17/23 Yes Bernardino Ditch, NP  atorvastatin (LIPITOR) 20 MG tablet Take 1 tablet by mouth daily. 05/08/21  Yes [provider]  benzonatate  (TESSALON ) 100 MG capsule Take 2 capsules (200 mg total) by mouth every 8 (eight) hours. 09/07/23  Yes Bernardino Ditch, NP  promethazine -dextromethorphan (PROMETHAZINE -DM) 6.25-15 MG/5ML syrup Take 5 mLs by mouth 4 (four) times daily as needed. 09/07/23  Yes Bernardino Ditch, NP  Spacer/Aero-Holding Raguel (AEROCHAMBER MV) inhaler Use as instructed 09/07/23  Yes Bernardino Ditch, NP  albuterol  (VENTOLIN  HFA) 108 (90 Base) MCG/ACT inhaler Inhale 1-2 puffs into the lungs every  6 (six) hours as needed for wheezing or shortness of breath. 09/07/23   Bernardino Ditch, NP  escitalopram (LEXAPRO) 10 MG tablet Take 10 mg by mouth daily. 07/09/21   [provider]  ipratropium (ATROVENT ) 0.06 % nasal spray Place 2 sprays into both nostrils 4 (four) times daily. 09/07/23   Bernardino Ditch, NP  omeprazole (PRILOSEC) 40 MG capsule  07/08/17 05/06/20  [provider]    Family History Family History  Problem Relation Age of Onset   Cancer Mother    Cancer Father     Social History Social History   Tobacco Use   Smoking status: Never   Smokeless tobacco: Never  Vaping Use   Vaping status: Never Used  Substance Use Topics   Alcohol use: Yes    Comment: rare   Drug use: Never     Allergies   Patient has no known allergies.   Review of Systems Review of Systems  Constitutional:  Negative for fever.  HENT:  Positive for congestion, ear pain, rhinorrhea and sinus pressure. Negative for sore throat.   Respiratory:  Positive for cough, shortness of breath and wheezing.      Physical Exam Triage Vital Signs ED Triage Vitals  Encounter Vitals Group     BP      Girls Systolic BP Percentile      Girls Diastolic BP Percentile      Boys Systolic BP Percentile      Boys  Diastolic BP Percentile      Pulse      Resp      Temp      Temp src      SpO2      Weight      Height      Head Circumference      Peak Flow      Pain Score      Pain Loc      Pain Education      Exclude from Growth Chart    No data found.  Updated Vital Signs BP (!) 144/88 (BP Location: Right Arm)   Pulse 79   Temp 98.6 F (37 C) (Oral)   Resp 17   SpO2 95%   Visual Acuity Right Eye Distance:   Left Eye Distance:   Bilateral Distance:    Right Eye Near:   Left Eye Near:    Bilateral Near:     Physical Exam Vitals and nursing note reviewed.  Constitutional:      Appearance: Normal appearance. He is not ill-appearing.  HENT:     Head: Normocephalic and  atraumatic.     Right Ear: Tympanic membrane, ear canal and external ear normal. There is no impacted cerumen.     Left Ear: Tympanic membrane, ear canal and external ear normal. There is no impacted cerumen.     Nose: Congestion and rhinorrhea present.     Comments: His mucosa is edematous with green discharge in both naris.  Patient has tenderness to compression of bilateral frontal and maxillary sinuses.    Mouth/Throat:     Mouth: Mucous membranes are moist.     Pharynx: Oropharynx is clear. Posterior oropharyngeal erythema present. No oropharyngeal exudate.     Comments: Mild erythema to the posterior pharynx with clear postnasal drip.  Cardiovascular:     Rate and Rhythm: Normal rate and regular rhythm.     Pulses: Normal pulses.     Heart sounds: Normal heart sounds. No murmur heard.    No friction rub. No gallop.  Pulmonary:     Effort: Pulmonary effort is normal.     Breath sounds: Wheezing present. No rhonchi or rales.     Comments: Scattered expiratory wheezing.  Musculoskeletal:     Cervical back: Normal range of motion and neck supple. No tenderness.  Lymphadenopathy:     Cervical: No cervical adenopathy.   Skin:    General: Skin is warm and dry.     Capillary Refill: Capillary refill takes less than 2 seconds.     Findings: No rash.   Neurological:     General: No focal deficit present.     Mental Status: He is alert and oriented to person, place, and time.      UC Treatments / Results  Labs (all labs ordered are listed, but only abnormal results are displayed) Labs Reviewed - No data to display  EKG   Radiology No results found.  Procedures Procedures (including critical care time)  Medications Ordered in UC Medications - No data to display  Initial Impression / Assessment and Plan / UC Course  I have reviewed the triage vital signs and the nursing notes.  Pertinent labs & imaging results that were available during my care of the patient were  reviewed by me and considered in my medical decision making (see chart for details).   Patient is a nontoxic-appearing 68 year old male presenting for evaluation 2 weeks with respiratory symptoms as outlined HPI above.  Of note, he is complaining of shortness of breath and wheezing though he is able to speak in full sentence without dyspnea or tachypnea in the exam room.  Respiratory rate at triage was 17 with a 95% room air oxygen saturation.  He is afebrile with an oral temp of 98.6.  His physical exam does reveal inflammation his upper respiratory tract as evidenced by inflamed mucosa with green nasal discharge.  Also tenderness to bilateral frontal and maxillary sinuses.  Green postnasal drip is also present posterior oropharynx.  Lungs demonstrate expiratory wheezing that is scattered.  Patient exam is consistent with pansinusitis.  I will discharge him home on Augmentin 875 twice daily with food for 10 days.  We discussed sinus irrigation and using Mucinex  to help break up the stickiness of his mucus.  I will also prescribe Atrovent  to help with the congestion and he can do 2 squirts in each nostril 4 times a day as needed.  Tessalon  Perles for cough in the day and Promethazine  DM cough syrup for cough at bedtime.  Additionally, I will prescribe an albuterol  inhaler and a spacer that he can use every 4-6 hours as needed for shortness of breath or wheezing.  Return precautions reviewed.   Final Clinical Impressions(s) / UC Diagnoses   Final diagnoses:  Acute non-recurrent pansinusitis  Acute cough     Discharge Instructions      The Augmentin twice daily with food for 10 days for treatment of your sinusitis.  Perform sinus irrigation 2-3 times a day with a NeilMed sinus rinse kit and distilled water.  Do not use tap water.  You can use plain over-the-counter Mucinex  every 6 hours to break up the stickiness of the mucus so your body can clear it.  Increase your oral fluid intake to thin out  your mucus so that is also able for your body to clear more easily.  Take an over-the-counter probiotic, such as Culturelle-align-activia, 1 hour after each dose of antibiotic to prevent diarrhea.  Use your albuterol  inhaler, with your spacer, and take 1 to 2 puffs every 4-6 hours as needed for shortness of breath or wheezing.  Use the Atrovent  nasal spray, 2 squirts in each nostril every 6 hours, as needed for runny nose and postnasal drip.  Use the Tessalon  Perles every 8 hours during the day.  Take them with a small sip of water.  They may give you some numbness to the base of your tongue or a metallic taste in your mouth, this is normal.  Use the Promethazine  DM cough syrup at bedtime for cough and congestion.  It will make you drowsy so do not take it during the day.  Return for reevaluation or see your primary care provider for any new or worsening symptoms.      ED Prescriptions     Medication Sig Dispense Auth. Provider   albuterol  (VENTOLIN  HFA) 108 (90 Base) MCG/ACT inhaler Inhale 1-2 puffs into the lungs every 6 (six) hours as needed for wheezing or shortness of breath. 18 g Bernardino Ditch, NP   Spacer/Aero-Holding Chambers (AEROCHAMBER MV) inhaler Use as instructed 1 each Bernardino Ditch, NP   ipratropium (ATROVENT ) 0.06 % nasal spray Place 2 sprays into both nostrils 4 (four) times daily. 15 mL Bernardino Ditch, NP   amoxicillin-clavulanate (AUGMENTIN) 875-125 MG tablet Take 1 tablet by mouth every 12 (twelve) hours for 10 days. 20 tablet Bernardino Ditch, NP   benzonatate  (TESSALON ) 100 MG capsule Take 2 capsules (200 mg  total) by mouth every 8 (eight) hours. 21 capsule Bernardino Ditch, NP   promethazine -dextromethorphan (PROMETHAZINE -DM) 6.25-15 MG/5ML syrup Take 5 mLs by mouth 4 (four) times daily as needed. 118 mL Bernardino Ditch, NP      PDMP not reviewed this encounter.   Bernardino Ditch, NP 09/07/23 [phone]

## 2023-10-31 ENCOUNTER — Ambulatory Visit
Admission: EM | Admit: 2023-10-31 | Discharge: 2023-10-31 | Disposition: A | Discharge status: Home / Self Care | Attending: Emergency Medicine | Admitting: Emergency Medicine

## 2023-10-31 DIAGNOSIS — R051 Acute cough: Secondary | ICD-10-CM

## 2023-10-31 DIAGNOSIS — J0141 Acute recurrent pansinusitis: Secondary | ICD-10-CM | POA: Diagnosis not present

## 2023-10-31 MED ORDER — AMOXICILLIN-POT CLAVULANATE 875-125 MG PO TABS: 1.0000 | ORAL_TABLET | Freq: Two times a day (BID) | ORAL | 0 refills | Status: AC

## 2023-10-31 MED ORDER — IPRATROPIUM BROMIDE 0.06 % NA SOLN: 2.0000 | Freq: Four times a day (QID) | NASAL | 0 refills | Status: AC

## 2023-10-31 MED ORDER — PREDNISONE 20 MG PO TABS: 40.0000 mg | ORAL_TABLET | Freq: Every day | ORAL | 0 refills | Status: AC

## 2023-10-31 NOTE — ED Provider Notes (Signed)
 [facility]    CSN: [id number] Arrival date & time: 10/31/23  0848      History   Chief Complaint Chief Complaint  Patient presents with   Cough   Nasal Congestion         HPI Derrick Haynes is a 68 y.o. male.   8 male patient, Derrick Haynes, presents to urgent care for evaluation of flu-like symptoms for 5 days(cough, worsening congestion, nasal drainage,sore throat)  EFY:HZMI, plantar fasciitis   The history is provided by the patient. No language interpreter was used.    Past Medical History:  Diagnosis Date   GERD (gastroesophageal reflux disease)    Plantar fasciitis     Patient Active Problem List   Diagnosis Date Noted   Acute recurrent pansinusitis 10/31/2023   Acute URI 08/10/2021   Acute cough 08/10/2021    Past Surgical History:  Procedure Laterality Date   BACK SURGERY     EYE SURGERY  10/2021   NASAL SINUS SURGERY         Home Medications    Prior to Admission medications   Medication Sig Start Date End Date Taking? Authorizing Provider  albuterol  (VENTOLIN  HFA) 108 (90 Base) MCG/ACT inhaler Inhale 1-2 puffs into the lungs every 6 (six) hours as needed for wheezing or shortness of breath. 09/07/23  Yes Bernardino Ditch, NP  amoxicillin -clavulanate (AUGMENTIN ) 875-125 MG tablet Take 1 tablet by mouth every 12 (twelve) hours. 10/31/23  Yes Stevon Gough, NP  atorvastatin (LIPITOR) 20 MG tablet Take 1 tablet by mouth daily. 05/08/21  Yes [provider]  escitalopram (LEXAPRO) 10 MG tablet Take 10 mg by mouth daily. 07/09/21  Yes [provider]  predniSONE  (DELTASONE ) 20 MG tablet Take 2 tablets (40 mg total) by mouth daily with breakfast for 5 days. 10/31/23 11/05/23 Yes Aydian Dimmick, Rilla, NP  Spacer/Aero-Holding Chambers (AEROCHAMBER MV) inhaler Use as instructed 09/07/23  Yes Bernardino Ditch, NP  benzonatate  (TESSALON ) 100 MG capsule Take 2 capsules (200 mg total) by mouth every 8 (eight) hours. 09/07/23   Bernardino Ditch,  NP  ipratropium (ATROVENT ) 0.06 % nasal spray Place 2 sprays into both nostrils 4 (four) times daily. 10/31/23   Olayinka Gathers, NP  promethazine -dextromethorphan (PROMETHAZINE -DM) 6.25-15 MG/5ML syrup Take 5 mLs by mouth 4 (four) times daily as needed. 09/07/23   Bernardino Ditch, NP  omeprazole (PRILOSEC) 40 MG capsule  07/08/17 05/06/20  [provider]    Family History Family History  Problem Relation Age of Onset   Cancer Mother    Cancer Father     Social History Social History   Tobacco Use   Smoking status: Never   Smokeless tobacco: Never  Vaping Use   Vaping status: Never Used  Substance Use Topics   Alcohol use: Yes    Comment: rare   Drug use: Never     Allergies   Patient has no known allergies.   Review of Systems Review of Systems  Constitutional:  Negative for fever.  HENT:  Positive for congestion, postnasal drip and sore throat.   Respiratory:  Positive for cough.   All other systems reviewed and are negative.    Physical Exam Triage Vital Signs ED Triage Vitals  Encounter Vitals Group     BP      Girls Systolic BP Percentile      Girls Diastolic BP Percentile      Boys Systolic BP Percentile      Boys Diastolic BP Percentile  Pulse      Resp      Temp      Temp src      SpO2      Weight      Height      Head Circumference      Peak Flow      Pain Score      Pain Loc      Pain Education      Exclude from Growth Chart    No data found.  Updated Vital Signs BP (!) 147/83 (BP Location: Left Arm)   Pulse 63   Temp 98.4 F (36.9 C) (Oral)   Resp 16   Ht 5' 9 (1.753 m)   Wt 230 lb (104.3 kg)   SpO2 96%   BMI 33.97 kg/m   Visual Acuity Right Eye Distance:   Left Eye Distance:   Bilateral Distance:    Right Eye Near:   Left Eye Near:    Bilateral Near:     Physical Exam Vitals and nursing note reviewed.  Constitutional:      General: He is not in acute distress.    Appearance: He is well-developed. He is  not ill-appearing or toxic-appearing.  HENT:     Head: Normocephalic.     Right Ear: Tympanic membrane is retracted.     Left Ear: Tympanic membrane is retracted.     Nose: Mucosal edema and congestion present.     Mouth/Throat:     Mouth: Mucous membranes are moist.     Pharynx: Uvula midline.  Eyes:     General: Lids are normal.     Conjunctiva/sclera: Conjunctivae normal.     Pupils: Pupils are equal, round, and reactive to light.  Cardiovascular:     Rate and Rhythm: Normal rate and regular rhythm.     Heart sounds: Normal heart sounds.  Pulmonary:     Effort: Pulmonary effort is normal. No respiratory distress.     Breath sounds: Normal breath sounds and air entry. No decreased breath sounds or wheezing.  Abdominal:     General: There is no distension.     Palpations: Abdomen is soft.  Musculoskeletal:        General: Normal range of motion.     Cervical back: Normal range of motion.  Skin:    General: Skin is warm and dry.     Findings: No rash.  Neurological:     General: No focal deficit present.     Mental Status: He is alert and oriented to person, place, and time.     GCS: GCS eye subscore is 4. GCS verbal subscore is 5. GCS motor subscore is 6.     Cranial Nerves: No cranial nerve deficit.     Sensory: No sensory deficit.  Psychiatric:        Attention and Perception: Attention normal.        Mood and Affect: Mood normal.        Speech: Speech normal.        Behavior: Behavior normal. Behavior is cooperative.      UC Treatments / Results  Labs (all labs ordered are listed, but only abnormal results are displayed) Labs Reviewed - No data to display  EKG   Radiology No results found.  Procedures Procedures (including critical care time)  Medications Ordered in UC Medications - No data to display  Initial Impression / Assessment and Plan / UC Course  I have reviewed the triage vital  signs and the nursing notes.  Pertinent labs & imaging results  that were available during my care of the patient were reviewed by me and considered in my medical decision making (see chart for details).    Discussed exam findings and plan of care with patient, referred to PCP/ENT for further evaluation if no improvement, strict go to ER precautions given.   Patient verbalized understanding to this provider.  Ddx: Pansinusitis, cough, allergies,viral illness Final Clinical Impressions(s) / UC Diagnoses   Final diagnoses:  Acute recurrent pansinusitis  Acute cough     Discharge Instructions      Take meds as prescribed(augmentin , prednisone , atrovent  nasal spray) Please call your ENT at [facility] for follow up of recurrent sinusitis-[place] number given Drink plenty of water Avoid caffeine Sit up 2-3 hours after last meal to help with GERD     ED Prescriptions     Medication Sig Dispense Auth. Provider   amoxicillin -clavulanate (AUGMENTIN ) 875-125 MG tablet Take 1 tablet by mouth every 12 (twelve) hours. 14 tablet Jamacia Jester, NP   ipratropium (ATROVENT ) 0.06 % nasal spray Place 2 sprays into both nostrils 4 (four) times daily. 15 mL Nyzir Dubois, NP   predniSONE  (DELTASONE ) 20 MG tablet Take 2 tablets (40 mg total) by mouth daily with breakfast for 5 days. 10 tablet Eulalah Rupert, Rilla, NP      PDMP not reviewed this encounter.   Aminta Rilla, NP 10/31/23 2005

## 2023-10-31 NOTE — ED Triage Notes (Signed)
 Pt c/o continued cough and congestion   Pt states that he has been here 2-3 times for continued cough and nasal drainage and it has not gone away.   Pt states that he has a lot of drainage and cough and it makes his throat sore.

## 2023-10-31 NOTE — Discharge Instructions (Addendum)
 Take meds as prescribed(augmentin , prednisone , atrovent  nasal spray) Please call your ENT at [facility] for follow up of recurrent sinusitis-[place] number given Drink plenty of water Avoid caffeine Sit up 2-3 hours after last meal to help with GERD
